# Patient Record
Sex: Male | Born: 1945 | Race: White | Hispanic: No | Marital: Married | Smoking: Never smoker
Health system: Southern US, Community
[De-identification: ages and names within clinical notes are randomized; demographics above are authoritative.]

## PROBLEM LIST (undated history)

## (undated) DIAGNOSIS — Z8709 Personal history of other diseases of the respiratory system: Secondary | ICD-10-CM

## (undated) DIAGNOSIS — E119 Type 2 diabetes mellitus without complications: Secondary | ICD-10-CM

## (undated) DIAGNOSIS — I82409 Acute embolism and thrombosis of unspecified deep veins of unspecified lower extremity: Secondary | ICD-10-CM

## (undated) DIAGNOSIS — M1990 Unspecified osteoarthritis, unspecified site: Secondary | ICD-10-CM

## (undated) DIAGNOSIS — M810 Age-related osteoporosis without current pathological fracture: Secondary | ICD-10-CM

## (undated) DIAGNOSIS — Z87442 Personal history of urinary calculi: Secondary | ICD-10-CM

## (undated) DIAGNOSIS — R7303 Prediabetes: Secondary | ICD-10-CM

## (undated) HISTORY — PX: NO PAST SURGERIES: SHX2092

## (undated) HISTORY — PX: SPINE SURGERY: SHX786

## (undated) HISTORY — DX: Age-related osteoporosis without current pathological fracture: M81.0

## (undated) HISTORY — DX: Type 2 diabetes mellitus without complications: E11.9

---

## 2003-09-04 ENCOUNTER — Ambulatory Visit (HOSPITAL_COMMUNITY): Admission: RE | Admit: 2003-09-04 | Discharge: 2003-09-04 | Payer: Self-pay | Admitting: Gastroenterology

## 2010-01-21 ENCOUNTER — Encounter: Admission: RE | Admit: 2010-01-21 | Discharge: 2010-01-21 | Payer: Self-pay | Admitting: Internal Medicine

## 2010-05-17 ENCOUNTER — Encounter: Admission: RE | Admit: 2010-05-17 | Discharge: 2010-05-17 | Payer: Self-pay | Admitting: Internal Medicine

## 2010-09-17 ENCOUNTER — Ambulatory Visit: Payer: Self-pay | Admitting: Vascular Surgery

## 2010-10-13 DIAGNOSIS — I82409 Acute embolism and thrombosis of unspecified deep veins of unspecified lower extremity: Secondary | ICD-10-CM

## 2010-10-13 HISTORY — DX: Acute embolism and thrombosis of unspecified deep veins of unspecified lower extremity: I82.409

## 2011-02-25 NOTE — Consult Note (Signed)
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  He has been  on chronic Coumadin therapy since that time.  Had a workup with Dr.  Donette Larry of [facility].  I have reviewed this.  He did have a  hypercoagulable workup which was negative.  The patient not recall any  specific trauma or other long travel that could have caused his DVT.  He  does work as a Visual merchandiser.  He reports that he bumps his legs quite  frequently.   PAST MEDICAL HISTORY:  Significant for paresthesias of both feet,  otherwise no medical difficulties.   SOCIAL HISTORY:  He is single with 2 children.  He works as a Visual merchandiser.  He does not smoke or drink alcohol.   FAMILY HISTORY:  Negative for premature atherosclerotic disease.   REVIEW OF SYSTEMS:  CONSTITUTIONAL:  Positive for weight gain up to 250  pounds.  He is 6 feet 1 inch tall.  VASCULAR:  For DVT.  SKIN:  Positive for rashes right below his knee consistent venous  hypertension.   He underwent repeat venous duplex in our office today are I have  reviewed this with the patient.  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  He also has significant deep venous valvular insufficiency.   I discussed at length with the patient I feel that it is safe for him to  stop his Coumadin therapy, and he will discuss this with Dr. Donette Larry.  I  would not recommend any follow-up ultrasounds.  I explained that the  skin changes were related to venous hypertension and that this would be  related mostly to the partial occlusion, also for valvular incompetence  in his right deep veins.  I explained the  importance of elevation and  also the importance of compression.  We have fitted him today with knee-  high 20-mm to 30-mmHg graduated compression stockings and instructed him  on the use of this to his right leg only.  He will continue his follow-  up with Dr. Donette Larry and see [place] again on an as-needed basis.     Larina Earthly, M.D.  Electronically Signed   TFE/MEDQ  D:  09/17/2010  T:  09/18/2010  Job:  [id number]   cc:   Georgann Housekeeper, MD

## 2011-02-25 NOTE — Procedures (Signed)
DUPLEX DEEP VENOUS EXAM - LOWER EXTREMITY   INDICATION:  Follow-up evaluation of known DVT.   HISTORY:  Edema:  Yes.  Trauma/Surgery:  No.  Pain:  No.  PE:  No.  Previous DVT:  Right femoral vein and popliteal vein, spring of 2011.  Anticoagulants:  Coumadin.  Other:   DUPLEX EXAM:                CFV   SFV   PopV  PTV    GSV                R  L  R  L  R  L  R   L  R  L  Thrombosis    o  o  +     +            o  Spontaneous   +  +  +     +  Phasic        +  +  +     +  Augmentation  +  +  +     +  Compressible  +  +  p     p  Competent     o  o  o     o   Legend:  + - yes  o - no  p - partial  D - decreased   IMPRESSION:  1. 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  2. Evidence of clinically significant deep venous insufficiency.  3. Calf veins could not be imaged due to excessive edema.      _____________________________  Larina Earthly, M.D.   EM/MEDQ  D:  09/17/2010  T:  09/17/2010  Job:  [id number]

## 2011-02-28 NOTE — Op Note (Signed)
NAME:  Cameron Young, Cameron Young                           ACCOUNT NO.:  [id number]   MEDICAL RECORD NO.:  [id number]                   PATIENT TYPE:  AMB   LOCATION:  ENDO                                 FACILITY:  [facility]   PHYSICIAN:  John C. Madilyn Fireman, M.D.                 DATE OF BIRTH:  1946-02-26   DATE OF PROCEDURE:  11/01/2002  DATE OF DISCHARGE:  09/04/2003                                 OPERATIVE REPORT   PROCEDURE:  Colonoscopy.   INDICATIONS FOR PROCEDURE:  Colon cancer screening in a 65 year old patient  with no previous screening.   DESCRIPTION OF PROCEDURE:  The patient was placed in the left lateral  decubitus position and placed on the pulse monitor with continuous low-flow  oxygen delivered by nasal cannula.  He was sedated with 62.5 mcg IV fentanyl  and 6 mg IV Versed for the previous EGD, and no further sedation was  required for this procedure.  The Olympus video colonoscope was inserted  into the rectum and advanced to the cecum, confirmed by transillumination at  McBurney's point and visualization of the ileocecal valve and appendiceal  orifice.  The prep was excellent.  The cecum, ascending, and transverse  colon all appeared normal with no masses, polyps, diverticula, or other  mucosal abnormalities.  Within the descending and sigmoid colon, there were  seen a few scattered diverticula and no other abnormalities.  The rectum  appeared normal, and retroflexed view of the anus revealed no obvious  internal hemorrhoids.  The scope was then withdrawn, and the patient  returned to the recovery room in stable condition.  He tolerated the  procedure well, and there were no immediate complications.   IMPRESSION:  1. Left-sided diverticulosis.  2. Otherwise, normal study.   PLAN:  Next colon screening by flexible sigmoidoscopy in 5 years.                                               John C. Madilyn Fireman, M.D.    JCH/MEDQ  D:  11/02/2003  T:  11/02/2003  Job:  [id number]   cc:    Brett Canales A. Cleta Alberts, M.D.  [address]  [place]  [place] [address]  Fax: [phone]

## 2011-02-28 NOTE — Op Note (Signed)
NAME:  Cameron Young, Cameron Young                           ACCOUNT NO.:  [id number]   MEDICAL RECORD NO.:  [id number]                   PATIENT TYPE:  AMB   LOCATION:  ENDO                                 FACILITY:  [facility]   PHYSICIAN:  John C. Madilyn Fireman, M.D.                 DATE OF BIRTH:  1946-04-24   DATE OF PROCEDURE:  09/04/2003  DATE OF DISCHARGE:                                 OPERATIVE REPORT   PROCEDURE:  Esophagogastroduodenoscopy.   INDICATIONS:  Chronic daily heartburn in a 65 year old patient undergoing  screening colonoscopy today.   DESCRIPTION OF PROCEDURE:  The patient was placed in the left lateral  decubitus position and placed on the pulse monitor with continuous low-flow  oxygen delivered by nasal cannula.  He was sedated with 62.5 mcg of fentanyl  and 6 mg IV Versed.  The Olympus video endoscope was advanced under direct  vision into the oropharynx and esophagus.  The esophagus was straight and of  normal caliber, the squamocolumnar line at 40 cm above a 2 cm hiatal hernia.  There was a single erosion with exudate just above the GE junction  consistent with moderate esophagitis.  There was no ring or stricture.  The  stomach was entered and a small amount of liquid secretions were suctioned  from the fundus.  Retroflexed view of the cardia confirmed the hiatal hernia  and was unremarkable.  The fundus, body, antrum and pylorus all appeared  normal.  The duodenum was entered and both bulb and second portion appeared  to be within normal limits.  The scope was then withdrawn and the patient  returned to the recovery room in stable condition.  He tolerated the  procedure well and there were no immediate complications.   IMPRESSION:  1. Small to moderate hiatal hernia.  2. Moderate esophagitis.   PLAN:  Will treat with proton pump inhibitor for at least two months  continue with a PPI or H2 block therapy afterwards.                                               John C.  Madilyn Fireman, M.D.    JCH/MEDQ  D:  09/04/2003  T:  09/04/2003  Job:  [id number]   cc:   Brett Canales A. Cleta Alberts, M.D.  [address]  [place]  [place] [address]  Fax: [phone]

## 2012-02-26 DIAGNOSIS — Z1331 Encounter for screening for depression: Secondary | ICD-10-CM | POA: Diagnosis not present

## 2012-02-26 DIAGNOSIS — R609 Edema, unspecified: Secondary | ICD-10-CM | POA: Diagnosis not present

## 2012-02-26 DIAGNOSIS — E669 Obesity, unspecified: Secondary | ICD-10-CM | POA: Diagnosis not present

## 2012-02-26 DIAGNOSIS — Z0000 Encounter for general adult medical examination without abnormal findings: Secondary | ICD-10-CM | POA: Diagnosis not present

## 2012-02-26 DIAGNOSIS — R7309 Other abnormal glucose: Secondary | ICD-10-CM | POA: Diagnosis not present

## 2012-02-26 DIAGNOSIS — Z125 Encounter for screening for malignant neoplasm of prostate: Secondary | ICD-10-CM | POA: Diagnosis not present

## 2012-02-26 DIAGNOSIS — I872 Venous insufficiency (chronic) (peripheral): Secondary | ICD-10-CM | POA: Diagnosis not present

## 2012-04-02 ENCOUNTER — Encounter (HOSPITAL_COMMUNITY): Payer: Self-pay | Admitting: *Deleted

## 2012-04-02 ENCOUNTER — Emergency Department (HOSPITAL_COMMUNITY)
Admission: EM | Admit: 2012-04-02 | Discharge: 2012-04-03 | Disposition: A | Discharge status: Home / Self Care | Payer: Medicare Other | Attending: Emergency Medicine | Admitting: Emergency Medicine

## 2012-04-02 DIAGNOSIS — K7689 Other specified diseases of liver: Secondary | ICD-10-CM | POA: Diagnosis not present

## 2012-04-02 DIAGNOSIS — N201 Calculus of ureter: Secondary | ICD-10-CM | POA: Insufficient documentation

## 2012-04-02 DIAGNOSIS — R109 Unspecified abdominal pain: Secondary | ICD-10-CM | POA: Diagnosis not present

## 2012-04-02 DIAGNOSIS — R110 Nausea: Secondary | ICD-10-CM | POA: Diagnosis not present

## 2012-04-02 DIAGNOSIS — N1330 Unspecified hydronephrosis: Secondary | ICD-10-CM | POA: Insufficient documentation

## 2012-04-02 DIAGNOSIS — N23 Unspecified renal colic: Secondary | ICD-10-CM

## 2012-04-02 DIAGNOSIS — Z86718 Personal history of other venous thrombosis and embolism: Secondary | ICD-10-CM | POA: Insufficient documentation

## 2012-04-02 HISTORY — DX: Acute embolism and thrombosis of unspecified deep veins of unspecified lower extremity: I82.409

## 2012-04-02 NOTE — ED Notes (Signed)
Pt c/o L groin pain starting at 1830 tonight. Pt c/o nausea, denies vomiting and dysuria.

## 2012-04-03 ENCOUNTER — Emergency Department (HOSPITAL_COMMUNITY): Payer: Medicare Other

## 2012-04-03 DIAGNOSIS — N1330 Unspecified hydronephrosis: Secondary | ICD-10-CM | POA: Diagnosis not present

## 2012-04-03 DIAGNOSIS — K7689 Other specified diseases of liver: Secondary | ICD-10-CM | POA: Diagnosis not present

## 2012-04-03 DIAGNOSIS — N201 Calculus of ureter: Secondary | ICD-10-CM | POA: Diagnosis not present

## 2012-04-03 LAB — URINALYSIS, ROUTINE W REFLEX MICROSCOPIC
Nitrite: NEGATIVE
Protein, ur: 30 mg/dL — AB
Specific Gravity, Urine: 1.027 (ref 1.005–1.030)
Urobilinogen, UA: 1 mg/dL (ref 0.0–1.0)
pH: 7 (ref 5.0–8.0)

## 2012-04-03 LAB — CBC
RBC: 5.17 MIL/uL (ref 4.22–5.81)
RDW: 12.8 % (ref 11.5–15.5)

## 2012-04-03 LAB — BASIC METABOLIC PANEL
BUN: 19 mg/dL (ref 6–23)
CO2: 22 mEq/L (ref 19–32)
Calcium: 9.9 mg/dL (ref 8.4–10.5)
Creatinine, Ser: 1.18 mg/dL (ref 0.50–1.35)
GFR calc non Af Amer: 63 mL/min — ABNORMAL LOW (ref >90)
Glucose, Bld: 164 mg/dL — ABNORMAL HIGH (ref 70–99)

## 2012-04-03 LAB — URINE MICROSCOPIC-ADD ON

## 2012-04-03 LAB — DIFFERENTIAL
Basophils Relative: 0 % (ref 0–1)
Eosinophils Absolute: 0 10*3/uL (ref 0.0–0.7)
Lymphocytes Relative: 11 % — ABNORMAL LOW (ref 12–46)
Monocytes Absolute: 0.7 10*3/uL (ref 0.1–1.0)

## 2012-04-03 MED ORDER — NAPROXEN SODIUM 220 MG PO TABS: ORAL_TABLET | ORAL | Status: DC

## 2012-04-03 MED ORDER — KETOROLAC TROMETHAMINE 15 MG/ML IJ SOLN
15.0000 mg | Freq: Once | INTRAMUSCULAR | Status: AC
  Administered 2012-04-03: 15 mg via INTRAVENOUS
  Filled 2012-04-03: qty 1

## 2012-04-03 MED ORDER — HYDROMORPHONE HCL 2 MG PO TABS: 2.0000 mg | ORAL_TABLET | ORAL | Status: AC | PRN

## 2012-04-03 MED ORDER — HYDROMORPHONE HCL PF 1 MG/ML IJ SOLN
1.0000 mg | Freq: Once | INTRAMUSCULAR | Status: AC
  Administered 2012-04-03: 1 mg via INTRAVENOUS
  Filled 2012-04-03: qty 1

## 2012-04-03 MED ORDER — ONDANSETRON HCL 4 MG/2ML IJ SOLN
4.0000 mg | Freq: Once | INTRAMUSCULAR | Status: AC
  Administered 2012-04-03: 4 mg via INTRAVENOUS
  Filled 2012-04-03: qty 2

## 2012-04-03 MED ORDER — ONDANSETRON 8 MG PO TBDP: 8.0000 mg | ORAL_TABLET | Freq: Three times a day (TID) | ORAL | Status: AC | PRN

## 2012-04-03 NOTE — Discharge Instructions (Signed)
Ureteral Colic (Kidney Stones) Ureteral colic is the result of a condition when kidney stones form inside the kidney. Once kidney stones are formed they may move into the tube that connects the kidney with the bladder (ureter). If this occurs, this condition may cause pain (colic) in the ureter.  CAUSES  Pain is caused by stone movement in the ureter and the obstruction caused by the stone. SYMPTOMS  The pain comes and goes as the ureter contracts around the stone. The pain is usually intense, sharp, and stabbing in character. The location of the pain may move as the stone moves through the ureter. When the stone is near the kidney the pain is usually located in the back and radiates to the belly (abdomen). When the stone is ready to pass into the bladder the pain is often located in the lower abdomen on the side the stone is located. At this location, the symptoms may mimic those of a urinary tract infection with urinary frequency. Once the stone is located here it often passes into the bladder and the pain disappears completely. TREATMENT   Your caregiver will provide you with medicine for pain relief.   You may require specialized follow-up X-rays.   The absence of pain does not always mean that the stone has passed. It may have just stopped moving. If the urine remains completely obstructed, it can cause loss of kidney function or even complete destruction of the involved kidney. It is your responsibility and in your interest that X-rays and follow-ups as suggested by your caregiver are completed. Relief of pain without passage of the stone can be associated with severe damage to the kidney, including loss of kidney function on that side.   If your stone does not pass on its own, additional measures may be taken by your caregiver to ensure its removal.  HOME CARE INSTRUCTIONS   Increase your fluid intake. Water is the preferred fluid since juices containing vitamin C may acidify the urine making  it less likely for certain stones (uric acid stones) to pass.   Strain all urine. A strainer will be provided. Keep all particulate matter or stones for your caregiver to inspect.   Take your pain medicine as directed.   Make a follow-up appointment with your caregiver as directed.   Remember that the goal is passage of your stone. The absence of pain does not mean the stone is gone. Follow your caregiver's instructions.   Only take over-the-counter or prescription medicines for pain, discomfort, or fever as directed by your caregiver.  SEEK MEDICAL CARE IF:   Pain cannot be controlled with the prescribed medicine.   You have a fever.   Pain continues for longer than your caregiver advises it should.   There is a change in the pain, and you develop chest discomfort or constant abdominal pain.   You feel faint or pass out.  MAKE SURE YOU:   Understand these instructions.   Will watch your condition.   Will get help right away if you are not doing well or get worse.  Document Released: 07/09/2005 Document Revised: 09/18/2011 Document Reviewed: 03/26/2011 Exit[employer] Patient Information 2012 [employer]. 

## 2012-04-03 NOTE — ED Provider Notes (Signed)
History     CSN: [id number]  Arrival date & time 04/02/12  2247   First MD Initiated Contact with Patient 04/03/12 0044      Chief Complaint  Patient presents with  . Groin Pain    (Consider location/radiation/quality/duration/timing/severity/associated sxs/prior treatment) HPI This is a 66 year old white male with a remote history of kidney stones. He is here with pain in his left groin beginning about 6:30 PM. The pain has been colicky and severe at times. He has had nausea and retching. He has not noted any hematuria or dysuria. There are no mitigating or exacerbating factors. The pain feels as if it is in the left testicle radiating up into his lower abdomen.  Past Medical History  Diagnosis Date  . Kidney stone   . DVT (deep venous thrombosis)     History reviewed. No pertinent past surgical history.  History reviewed. No pertinent family history.  History  Substance Use Topics  . Smoking status: Former Games developer  . Smokeless tobacco: Not on file  . Alcohol Use: No      Review of Systems  All other systems reviewed and are negative.    Allergies  Review of patient's allergies indicates no known allergies.  Home Medications  No current outpatient prescriptions on file.  BP 175/87  Pulse 78  Temp 97.7 F (36.5 C)  Resp 22  SpO2 98%  Physical Exam General: Well-develoxped, well-nourished male in no acute distress; appearance consistent with age of record HENT: normocephalic, atraumatic Eyes: pupils equal round and reactive to light; extraocular muscles intact Neck: supple Heart: regular rate and rhythm Lungs: clear to auscultation bilaterally Abdomen: soft; nondistended; nontender; bowel sounds present GU: no flank tenderness; no testicular mass or tenderness palpated Extremities: No deformity; full range of motion Neurologic: Awake, alert and oriented; motor function intact in all extremities and symmetric; no facial droop Skin: Warm and  dry Psychiatric: Normal mood and affect    ED Course  Procedures (including critical care time)     MDM   Nursing notes and vitals signs, including pulse oximetry, reviewed.  Summary of this visit's results, reviewed by myself:  Labs:  Results for orders placed during the hospital encounter of 04/02/12  URINALYSIS, ROUTINE W REFLEX MICROSCOPIC      Component Value Range   Color, Urine RED (*) YELLOW   APPearance CLOUDY (*) CLEAR   Specific Gravity, Urine 1.027  1.005 - 1.030   pH 7.0  5.0 - 8.0   Glucose, UA NEGATIVE  NEGATIVE mg/dL   Hgb urine dipstick LARGE (*) NEGATIVE   Bilirubin Urine NEGATIVE  NEGATIVE   Ketones, ur TRACE (*) NEGATIVE mg/dL   Protein, ur 30 (*) NEGATIVE mg/dL   Urobilinogen, UA 1.0  0.0 - 1.0 mg/dL   Nitrite NEGATIVE  NEGATIVE   Leukocytes, UA SMALL (*) NEGATIVE  CBC      Component Value Range   WBC 12.6 (*) 4.0 - 10.5 K/uL   RBC 5.17  4.22 - 5.81 MIL/uL   Hemoglobin 16.1  13.0 - 17.0 g/dL   HCT 13.0  86.5 - 78.4 %   MCV 89.7  78.0 - 100.0 fL   MCH 31.1  26.0 - 34.0 pg   MCHC 34.7  30.0 - 36.0 g/dL   RDW 69.6  29.5 - 28.4 %   Platelets 192  150 - 400 K/uL  DIFFERENTIAL      Component Value Range   Neutrophils Relative 83 (*) 43 - 77 %  Neutro Abs 10.5 (*) 1.7 - 7.7 K/uL   Lymphocytes Relative 11 (*) 12 - 46 %   Lymphs Abs 1.4  0.7 - 4.0 K/uL   Monocytes Relative 6  3 - 12 %   Monocytes Absolute 0.7  0.1 - 1.0 K/uL   Eosinophils Relative 0  0 - 5 %   Eosinophils Absolute 0.0  0.0 - 0.7 K/uL   Basophils Relative 0  0 - 1 %   Basophils Absolute 0.0  0.0 - 0.1 K/uL  BASIC METABOLIC PANEL      Component Value Range   Sodium 136  135 - 145 mEq/L   Potassium 5.8 (*) 3.5 - 5.1 mEq/L   Chloride 101  96 - 112 mEq/L   CO2 22  19 - 32 mEq/L   Glucose, Bld 164 (*) 70 - 99 mg/dL   BUN 19  6 - 23 mg/dL   Creatinine, Ser 8.29  0.50 - 1.35 mg/dL   Calcium 9.9  8.4 - 56.2 mg/dL   GFR calc non Af Amer 63 (*) >90 mL/min   GFR calc Af Amer 72 (*)  >90 mL/min  URINE MICROSCOPIC-ADD ON      Component Value Range   Squamous Epithelial / LPF RARE  RARE   WBC, UA 3-6  <3 WBC/hpf   RBC / HPF TOO NUMEROUS TO COUNT  <3 RBC/hpf   Bacteria, UA MANY (*) RARE   Urine-Other MUCOUS PRESENT      Imaging Studies: Ct Abdomen Pelvis Wo Contrast  04/03/2012  *RADIOLOGY REPORT*  Clinical Data: Groin pain.  CT ABDOMEN AND PELVIS WITHOUT CONTRAST  Technique:  Multidetector CT imaging of the abdomen and pelvis was performed following the standard protocol without intravenous contrast.  Comparison: None.  Findings: Dependent atelectasis in the lower lobes.  Heart is normal size.  No effusions.  Diffuse fatty infiltration of the liver.  Gallbladder, stomach, spleen, pancreas, adrenals and right kidney are unremarkable. There is left hydronephrosis and perinephric stranding due to a 5 mm stone at the pelvic brim.  Urinary bladder is unremarkable. Prostate mildly prominent.  Extensive descending colonic and sigmoid diverticulosis.  No active diverticulitis.  Small bowel is decompressed.  No free fluid, free air or adenopathy.  No acute bony abnormality.  IMPRESSION: 5 mm mid left ureteral stone at the pelvic brim with mild left hydronephrosis and perinephric stranding.  Fatty infiltration of the liver.  Original Report Authenticated By: Cyndie Chime, M.D.   1:39 AM Pain well-controlled.       Hanley Seamen, MD 04/03/12 [phone]

## 2012-04-03 NOTE — ED Notes (Signed)
Pt verbalizes understanding.  Pt wife at bedside 

## 2012-09-28 DIAGNOSIS — R7309 Other abnormal glucose: Secondary | ICD-10-CM | POA: Diagnosis not present

## 2013-02-28 DIAGNOSIS — E119 Type 2 diabetes mellitus without complications: Secondary | ICD-10-CM | POA: Diagnosis not present

## 2013-02-28 DIAGNOSIS — E669 Obesity, unspecified: Secondary | ICD-10-CM | POA: Diagnosis not present

## 2013-02-28 DIAGNOSIS — Z0000 Encounter for general adult medical examination without abnormal findings: Secondary | ICD-10-CM | POA: Diagnosis not present

## 2013-02-28 DIAGNOSIS — I872 Venous insufficiency (chronic) (peripheral): Secondary | ICD-10-CM | POA: Diagnosis not present

## 2013-02-28 DIAGNOSIS — N400 Enlarged prostate without lower urinary tract symptoms: Secondary | ICD-10-CM | POA: Diagnosis not present

## 2013-02-28 DIAGNOSIS — Z1331 Encounter for screening for depression: Secondary | ICD-10-CM | POA: Diagnosis not present

## 2013-03-25 DIAGNOSIS — H524 Presbyopia: Secondary | ICD-10-CM | POA: Diagnosis not present

## 2013-03-25 DIAGNOSIS — E119 Type 2 diabetes mellitus without complications: Secondary | ICD-10-CM | POA: Diagnosis not present

## 2013-03-25 DIAGNOSIS — H52209 Unspecified astigmatism, unspecified eye: Secondary | ICD-10-CM | POA: Diagnosis not present

## 2013-08-29 DIAGNOSIS — Z23 Encounter for immunization: Secondary | ICD-10-CM | POA: Diagnosis not present

## 2013-08-29 DIAGNOSIS — E119 Type 2 diabetes mellitus without complications: Secondary | ICD-10-CM | POA: Diagnosis not present

## 2013-08-29 DIAGNOSIS — I872 Venous insufficiency (chronic) (peripheral): Secondary | ICD-10-CM | POA: Diagnosis not present

## 2014-03-15 ENCOUNTER — Ambulatory Visit
Admission: RE | Admit: 2014-03-15 | Discharge: 2014-03-15 | Disposition: A | Discharge status: Home / Self Care | Payer: Medicare Other | Source: Ambulatory Visit | Attending: Internal Medicine | Admitting: Internal Medicine

## 2014-03-15 ENCOUNTER — Other Ambulatory Visit: Payer: Self-pay | Admitting: Internal Medicine

## 2014-03-15 DIAGNOSIS — M169 Osteoarthritis of hip, unspecified: Secondary | ICD-10-CM | POA: Diagnosis not present

## 2014-03-15 DIAGNOSIS — M25559 Pain in unspecified hip: Secondary | ICD-10-CM

## 2014-03-15 DIAGNOSIS — M1610 Unilateral primary osteoarthritis, unspecified hip: Secondary | ICD-10-CM | POA: Diagnosis not present

## 2014-03-30 DIAGNOSIS — M25559 Pain in unspecified hip: Secondary | ICD-10-CM | POA: Diagnosis not present

## 2014-04-20 DIAGNOSIS — M25519 Pain in unspecified shoulder: Secondary | ICD-10-CM | POA: Diagnosis not present

## 2014-04-26 DIAGNOSIS — M1610 Unilateral primary osteoarthritis, unspecified hip: Secondary | ICD-10-CM | POA: Diagnosis not present

## 2014-05-08 DIAGNOSIS — M545 Low back pain, unspecified: Secondary | ICD-10-CM | POA: Diagnosis not present

## 2014-05-08 DIAGNOSIS — M1610 Unilateral primary osteoarthritis, unspecified hip: Secondary | ICD-10-CM | POA: Diagnosis not present

## 2014-05-08 DIAGNOSIS — M25559 Pain in unspecified hip: Secondary | ICD-10-CM | POA: Diagnosis not present

## 2014-05-11 ENCOUNTER — Other Ambulatory Visit: Payer: Self-pay | Admitting: Orthopedic Surgery

## 2014-05-11 DIAGNOSIS — M1611 Unilateral primary osteoarthritis, right hip: Secondary | ICD-10-CM

## 2014-05-11 DIAGNOSIS — M4807 Spinal stenosis, lumbosacral region: Secondary | ICD-10-CM

## 2014-05-19 ENCOUNTER — Ambulatory Visit
Admission: RE | Admit: 2014-05-19 | Discharge: 2014-05-19 | Disposition: A | Discharge status: Home / Self Care | Payer: Medicare Other | Source: Ambulatory Visit | Attending: Orthopedic Surgery | Admitting: Orthopedic Surgery

## 2014-05-19 DIAGNOSIS — M5126 Other intervertebral disc displacement, lumbar region: Secondary | ICD-10-CM | POA: Diagnosis not present

## 2014-05-19 DIAGNOSIS — M545 Low back pain, unspecified: Secondary | ICD-10-CM | POA: Diagnosis not present

## 2014-05-19 DIAGNOSIS — M4807 Spinal stenosis, lumbosacral region: Secondary | ICD-10-CM

## 2014-05-19 DIAGNOSIS — M1611 Unilateral primary osteoarthritis, right hip: Secondary | ICD-10-CM

## 2014-05-19 MED ORDER — IOHEXOL 180 MG/ML  SOLN
15.0000 mL | Freq: Once | INTRAMUSCULAR | Status: AC | PRN
  Administered 2014-05-19: 15 mL via INTRATHECAL

## 2014-05-19 MED ORDER — DIAZEPAM 5 MG PO TABS
10.0000 mg | ORAL_TABLET | Freq: Once | ORAL | Status: AC
  Administered 2014-05-19: 10 mg via ORAL

## 2014-05-19 MED ORDER — ONDANSETRON HCL 4 MG/2ML IJ SOLN: 4.0000 mg | Freq: Four times a day (QID) | INTRAMUSCULAR | Status: DC | PRN

## 2014-05-19 NOTE — Discharge Instructions (Signed)

## 2014-05-26 ENCOUNTER — Other Ambulatory Visit: Payer: Self-pay | Admitting: Surgical

## 2014-05-26 DIAGNOSIS — M5126 Other intervertebral disc displacement, lumbar region: Secondary | ICD-10-CM | POA: Diagnosis not present

## 2014-05-30 ENCOUNTER — Encounter (HOSPITAL_COMMUNITY): Payer: Self-pay | Admitting: Pharmacy Technician

## 2014-05-30 ENCOUNTER — Encounter (HOSPITAL_COMMUNITY)
Admission: RE | Admit: 2014-05-30 | Discharge: 2014-05-30 | Disposition: A | Discharge status: Home / Self Care | Payer: Medicare Other | Source: Ambulatory Visit | Attending: Orthopedic Surgery | Admitting: Orthopedic Surgery

## 2014-05-30 ENCOUNTER — Ambulatory Visit (HOSPITAL_COMMUNITY)
Admission: RE | Admit: 2014-05-30 | Discharge: 2014-05-30 | Disposition: A | Discharge status: Home / Self Care | Payer: Medicare Other | Source: Ambulatory Visit | Attending: Surgical | Admitting: Surgical

## 2014-05-30 ENCOUNTER — Encounter (HOSPITAL_COMMUNITY): Payer: Self-pay

## 2014-05-30 DIAGNOSIS — M5137 Other intervertebral disc degeneration, lumbosacral region: Secondary | ICD-10-CM | POA: Diagnosis not present

## 2014-05-30 DIAGNOSIS — M51379 Other intervertebral disc degeneration, lumbosacral region without mention of lumbar back pain or lower extremity pain: Secondary | ICD-10-CM | POA: Diagnosis not present

## 2014-05-30 DIAGNOSIS — Z01818 Encounter for other preprocedural examination: Secondary | ICD-10-CM | POA: Insufficient documentation

## 2014-05-30 DIAGNOSIS — J40 Bronchitis, not specified as acute or chronic: Secondary | ICD-10-CM | POA: Diagnosis not present

## 2014-05-30 DIAGNOSIS — M519 Unspecified thoracic, thoracolumbar and lumbosacral intervertebral disc disorder: Secondary | ICD-10-CM | POA: Diagnosis not present

## 2014-05-30 HISTORY — DX: Prediabetes: R73.03

## 2014-05-30 HISTORY — DX: Personal history of other diseases of the respiratory system: Z87.09

## 2014-05-30 HISTORY — DX: Unspecified osteoarthritis, unspecified site: M19.90

## 2014-05-30 HISTORY — DX: Personal history of urinary calculi: Z87.442

## 2014-05-30 LAB — CBC
HCT: 43.2 % (ref 39.0–52.0)
Hemoglobin: 14.6 g/dL (ref 13.0–17.0)
MCH: 30.6 pg (ref 26.0–34.0)
MCHC: 33.8 g/dL (ref 30.0–36.0)
MCV: 90.6 fL (ref 78.0–100.0)
PLATELETS: 207 10*3/uL (ref 150–400)
RBC: 4.77 MIL/uL (ref 4.22–5.81)
RDW: 12.3 % (ref 11.5–15.5)
WBC: 8.1 10*3/uL (ref 4.0–10.5)

## 2014-05-30 LAB — URINALYSIS, ROUTINE W REFLEX MICROSCOPIC
Bilirubin Urine: NEGATIVE
Glucose, UA: NEGATIVE mg/dL
Hgb urine dipstick: NEGATIVE
Ketones, ur: NEGATIVE mg/dL
Leukocytes, UA: NEGATIVE
Nitrite: NEGATIVE
Protein, ur: NEGATIVE mg/dL
Specific Gravity, Urine: 1.026 (ref 1.005–1.030)
Urobilinogen, UA: 1 mg/dL (ref 0.0–1.0)
pH: 5 (ref 5.0–8.0)

## 2014-05-30 LAB — COMPREHENSIVE METABOLIC PANEL
ALT: 23 U/L (ref 0–53)
AST: 21 U/L (ref 0–37)
Albumin: 3.7 g/dL (ref 3.5–5.2)
Alkaline Phosphatase: 61 U/L (ref 39–117)
Anion gap: 14 (ref 5–15)
BUN: 13 mg/dL (ref 6–23)
CO2: 24 mEq/L (ref 19–32)
Calcium: 9.7 mg/dL (ref 8.4–10.5)
Chloride: 101 mEq/L (ref 96–112)
Creatinine, Ser: 1.05 mg/dL (ref 0.50–1.35)
GFR calc Af Amer: 82 mL/min — ABNORMAL LOW (ref >90)
GFR calc non Af Amer: 71 mL/min — ABNORMAL LOW (ref >90)
Glucose, Bld: 130 mg/dL — ABNORMAL HIGH (ref 70–99)
Potassium: 4.1 mEq/L (ref 3.7–5.3)
Sodium: 139 mEq/L (ref 137–147)
Total Bilirubin: 0.5 mg/dL (ref 0.3–1.2)
Total Protein: 7.3 g/dL (ref 6.0–8.3)

## 2014-05-30 LAB — SURGICAL PCR SCREEN
MRSA, PCR: POSITIVE — AB
Staphylococcus aureus: POSITIVE — AB

## 2014-05-30 LAB — PROTIME-INR
INR: 1.05 (ref 0.00–1.49)
Prothrombin Time: 13.7 seconds (ref 11.6–15.2)

## 2014-05-30 LAB — APTT: aPTT: 31 seconds (ref 24–37)

## 2014-05-30 NOTE — Progress Notes (Signed)
Mupirocin 2% ointment called into [facility] on [address]. Positive results faxed to Dr. Charlestine Night pod, left a message with Judeen Hammans at [place], and left a message with the patient about the Mupirocin treatment.

## 2014-05-30 NOTE — H&P (Signed)
Cameron Young is an 68 y.o. male.   Chief Complaint: back pain HPI: The patient is a 68 year old male with the chief complaint of low back pain. He has been dealing with this for about 3 months. No injury or change in activity. He started having pain along the lateral aspect of his right hip and was unresponsive to treatment over his greater trochanteric bursa. 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 He continued to develop increased low back pain as well as radicular symptoms in the right LE. CT of the lumbar spine showed lumbar disc herniation at L4-L5 on the right.   Past Medical History  Diagnosis Date  . Kidney stone   . DVT (deep venous thrombosis)   Diabetes mellitus, type II  Social History:  reports that he has quit smoking. He reports that he does not drink alcohol or use illicit drugs.  Allergies: No Known Allergies   Current outpatient prescriptions:  MetFORMIN HCl (500MG  Tablet, Oral)    Review of Systems  Constitutional: Negative.   HENT: Negative.   Eyes: Negative.   Respiratory: Negative.   Cardiovascular: Negative.   Gastrointestinal: Negative.   Genitourinary: Negative.   Musculoskeletal: Positive for back pain and joint pain. Negative for falls, myalgias and neck pain.  Skin: Negative.   Neurological: Positive for tingling, sensory change and focal weakness. Negative for dizziness, tremors, speech change, seizures and loss of consciousness.  Endo/Heme/Allergies: Negative.   Psychiatric/Behavioral: Negative.    Vitals Weight: 252 lb Height: 72 in Body Surface Area: 2.41 m Body Mass Index: 34.18 kg/m Pulse: 74 (Regular) BP: 143/87 (Sitting, Left Arm, Standard)  Physical Exam  Constitutional: He is oriented to person, place, and time. He appears well-developed and well-nourished.  Obese  HENT:  Head: Normocephalic and atraumatic.  Right Ear: External ear normal.  Left Ear: External ear normal.  Nose: Nose normal.   Mouth/Throat: Oropharynx is clear and moist.  Eyes: Conjunctivae and EOM are normal.  Neck: Normal range of motion. Neck supple.  Cardiovascular: Normal rate, regular rhythm, normal heart sounds and intact distal pulses.   No murmur heard. Respiratory: Effort normal and breath sounds normal. No respiratory distress. He has no wheezes.  GI: Soft. Bowel sounds are normal. He exhibits no distension. There is no tenderness.  Musculoskeletal:       Right hip: He exhibits decreased range of motion. He exhibits normal strength.       Left hip: Normal.       Right knee: Normal.       Left knee: Normal.       Lumbar back: He exhibits decreased range of motion, tenderness, pain and spasm.       Right lower leg: He exhibits no tenderness and no swelling.       Left lower leg: He exhibits no tenderness and no swelling.  Neurological: He is alert and oriented to person, place, and time. He has normal reflexes. A sensory deficit is present.  Weakness of the dorsiflexors and toe extensors of the right foot. Decreased sensation to light touch along L4-L5 distribution  Skin: No rash noted. No erythema.  Psychiatric: He has a normal mood and affect. His behavior is normal.     Assessment/Plan Lumbar disc herniation L4-L5 right He needs a lumbar hemilaminectomy and microdiscectomy L4-L5 on the right. The possible complications of spinal surgery number one could be infection, which is extremely rare. We do use antibiotics prior to the surgery  and during surgery and after surgery. Number two is always a slight degree of probability that you could develop a blood clot in your leg after any type of surgery and we try our best to prevent that with aspirin post op when it is safe to begin. The third is a dural leak. That is the spinal fluid leak that could occur. At certain rare times the bone or the disc could literally stick to the dura which is the lining which contains the spinal fluid and we could develop a  small tear in that lining which we then patch up. That is an extremely rare complication. The last and final complication is a recurrent disc rupture. That means that you could rupture another small piece of disc later on down the road and there is about a 2% chance of that.  Cameron Young LAUREN 05/30/2014, 11:46 AM  Physical performed by Dr. Latanya Maudlin, MD Documented by Ardeen Jourdain, PA-C

## 2014-05-30 NOTE — Patient Instructions (Addendum)
Blaine  05/30/2014   Your procedure is scheduled on: Friday 06/02/14  Report to [facility] at 07:00 AM.  Call this number if you have problems the morning of surgery [phone]: [phone]   Remember:   Do not eat food or drink liquids After Midnight.   Do not wear jewelry, make-up or nail polish.  Do not wear lotions, powders, or perfumes. You may wear deodorant.  Do not shave 48 hours prior to surgery. Men may shave face and neck.  Do not bring valuables to the hospital.  Contacts, dentures or bridgework may not be worn into surgery.  Leave suitcase in the car. After surgery it may be brought to your room.   Please read over the following fact sheets that you were given: MRSA Information Paulette Blanch, RN  pre op nurse call if needed [phone]    [facility] - Preparing for Surgery Before surgery, you can play an important role.  Because skin is not sterile, your skin needs to be as free of germs as possible.  You can reduce the number of germs on your skin by washing with CHG (chlorahexidine gluconate) soap before surgery.  CHG is an antiseptic cleaner which kills germs and bonds with the skin to continue killing germs even after washing. Please DO NOT use if you have an allergy to CHG or antibacterial soaps.  If your skin becomes reddened/irritated stop using the CHG and inform your nurse when you arrive at Short Stay. Do not shave (including legs and underarms) for at least 48 hours prior to the first CHG shower.  You may shave your face/neck. Please follow these instructions carefully:  1.  Shower with CHG Soap the night before surgery and the  morning of Surgery.  2.  If you choose to wash your hair, wash your hair first as usual with your  normal  shampoo.  3.  After you shampoo, rinse your hair and body thoroughly to remove the  shampoo.                            4.  Use CHG as you would any other liquid soap.  You can apply chg directly  to the skin and  wash                       Gently with a scrungie or clean washcloth.  5.  Apply the CHG Soap to your body ONLY FROM THE NECK DOWN.   Do not use on face/ open                           Wound or open sores. Avoid contact with eyes, ears mouth and genitals (private parts).                       Wash face,  Genitals (private parts) with your normal soap.             6.  Wash thoroughly, paying special attention to the area where your surgery  will be performed.  7.  Thoroughly rinse your body with warm water from the neck down.  8.  DO NOT shower/wash with your normal soap after using and rinsing off  the CHG Soap.                9.  Fraser Din  yourself dry with a clean towel.            10.  Wear clean pajamas.            11.  Place clean sheets on your bed the night of your first shower and do not  sleep with pets. Day of Surgery : Do not apply any lotions/deodorants the morning of surgery.  Please wear clean clothes to the hospital/surgery center.  FAILURE TO FOLLOW THESE INSTRUCTIONS MAY RESULT IN THE CANCELLATION OF YOUR SURGERY PATIENT SIGNATURE_________________________________  NURSE SIGNATURE__________________________________  ________________________________________________________________________   Adam Phenix  An incentive spirometer is a tool that can help keep your lungs clear and active. This tool measures how well you are filling your lungs with each breath. Taking long deep breaths may help reverse or decrease the chance of developing breathing (pulmonary) problems (especially infection) following:  A long period of time when you are unable to move or be active. BEFORE THE PROCEDURE   If the spirometer includes an indicator to show your best effort, your nurse or respiratory therapist will set it to a desired goal.  If possible, sit up straight or lean slightly forward. Try not to slouch.  Hold the incentive spirometer in an upright position. INSTRUCTIONS FOR USE   1. Sit on the edge of your bed if possible, or sit up as far as you can in bed or on a chair. 2. Hold the incentive spirometer in an upright position. 3. Breathe out normally. 4. Place the mouthpiece in your mouth and seal your lips tightly around it. 5. Breathe in slowly and as deeply as possible, raising the piston or the ball toward the top of the column. 6. Hold your breath for 3-5 seconds or for as long as possible. Allow the piston or ball to fall to the bottom of the column. 7. Remove the mouthpiece from your mouth and breathe out normally. 8. Rest for a few seconds and repeat Steps 1 through 7 at least 10 times every 1-2 hours when you are awake. Take your time and take a few normal breaths between deep breaths. 9. The spirometer may include an indicator to show your best effort. Use the indicator as a goal to work toward during each repetition. 10. After each set of 10 deep breaths, practice coughing to be sure your lungs are clear. If you have an incision (the cut made at the time of surgery), support your incision when coughing by placing a pillow or rolled up towels firmly against it. Once you are able to get out of bed, walk around indoors and cough well. You may stop using the incentive spirometer when instructed by your caregiver.  RISKS AND COMPLICATIONS  Take your time so you do not get dizzy or light-headed.  If you are in pain, you may need to take or ask for pain medication before doing incentive spirometry. It is harder to take a deep breath if you are having pain. AFTER USE  Rest and breathe slowly and easily.  It can be helpful to keep track of a log of your progress. Your caregiver can provide you with a simple table to help with this. If you are using the spirometer at home, follow these instructions: [place] IF:   You are having difficultly using the spirometer.  You have trouble using the spirometer as often as instructed.  Your pain medication is  not giving enough relief while using the spirometer.  You develop fever of  100.5 F (38.1 C) or higher. SEEK IMMEDIATE MEDICAL CARE IF:   You cough up bloody sputum that had not been present before.  You develop fever of 102 F (38.9 C) or greater.  You develop worsening pain at or near the incision site. MAKE SURE YOU:   Understand these instructions.  Will watch your condition.  Will get help right away if you are not doing well or get worse. Document Released: 02/09/2007 Document Revised: 12/22/2011 Document Reviewed: 04/12/2007 [facility] Patient Information 2014 [place], [place].   ________________________________________________________________________

## 2014-05-31 NOTE — Progress Notes (Signed)
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

## 2014-06-02 ENCOUNTER — Ambulatory Visit (HOSPITAL_COMMUNITY): Payer: Medicare Other

## 2014-06-02 ENCOUNTER — Observation Stay (HOSPITAL_COMMUNITY)
Admission: RE | Admit: 2014-06-02 | Discharge: 2014-06-03 | Disposition: A | Discharge status: Home / Self Care | Payer: Medicare Other | Source: Ambulatory Visit | Attending: Orthopedic Surgery | Admitting: Orthopedic Surgery

## 2014-06-02 ENCOUNTER — Encounter (HOSPITAL_COMMUNITY): Payer: Medicare Other | Admitting: Anesthesiology

## 2014-06-02 ENCOUNTER — Ambulatory Visit (HOSPITAL_COMMUNITY): Payer: Medicare Other | Admitting: Anesthesiology

## 2014-06-02 ENCOUNTER — Encounter (HOSPITAL_COMMUNITY): Payer: Self-pay | Admitting: *Deleted

## 2014-06-02 ENCOUNTER — Encounter (HOSPITAL_COMMUNITY)
Admission: RE | Disposition: A | Discharge status: Home / Self Care | Payer: Self-pay | Source: Ambulatory Visit | Attending: Orthopedic Surgery

## 2014-06-02 DIAGNOSIS — M216X9 Other acquired deformities of unspecified foot: Secondary | ICD-10-CM | POA: Diagnosis not present

## 2014-06-02 DIAGNOSIS — M48062 Spinal stenosis, lumbar region with neurogenic claudication: Secondary | ICD-10-CM | POA: Diagnosis present

## 2014-06-02 DIAGNOSIS — Z79899 Other long term (current) drug therapy: Secondary | ICD-10-CM | POA: Insufficient documentation

## 2014-06-02 DIAGNOSIS — M5126 Other intervertebral disc displacement, lumbar region: Secondary | ICD-10-CM | POA: Diagnosis not present

## 2014-06-02 DIAGNOSIS — E119 Type 2 diabetes mellitus without complications: Secondary | ICD-10-CM | POA: Diagnosis not present

## 2014-06-02 DIAGNOSIS — M519 Unspecified thoracic, thoracolumbar and lumbosacral intervertebral disc disorder: Secondary | ICD-10-CM | POA: Diagnosis not present

## 2014-06-02 DIAGNOSIS — M48061 Spinal stenosis, lumbar region without neurogenic claudication: Secondary | ICD-10-CM | POA: Diagnosis not present

## 2014-06-02 DIAGNOSIS — Z86718 Personal history of other venous thrombosis and embolism: Secondary | ICD-10-CM | POA: Insufficient documentation

## 2014-06-02 HISTORY — PX: HEMI-MICRODISCECTOMY LUMBAR LAMINECTOMY LEVEL 1: SHX5846

## 2014-06-02 SURGERY — HEMI-MICRODISCECTOMY LUMBAR LAMINECTOMY LEVEL 1
Anesthesia: General | Site: Back | Laterality: Right

## 2014-06-02 MED ORDER — LACTATED RINGERS IV SOLN
INTRAVENOUS | Status: DC
  Administered 2014-06-02: 100 mL/h via INTRAVENOUS
  Administered 2014-06-02: 21:00:00 via INTRAVENOUS

## 2014-06-02 MED ORDER — BUPIVACAINE-EPINEPHRINE (PF) 0.5% -1:200000 IJ SOLN
INTRAMUSCULAR | Status: AC
  Filled 2014-06-02: qty 30

## 2014-06-02 MED ORDER — LACTATED RINGERS IV SOLN: INTRAVENOUS | Status: DC

## 2014-06-02 MED ORDER — FENTANYL CITRATE 0.05 MG/ML IJ SOLN: 25.0000 ug | INTRAMUSCULAR | Status: DC | PRN

## 2014-06-02 MED ORDER — ATROPINE SULFATE 0.4 MG/ML IJ SOLN
INTRAMUSCULAR | Status: AC
Start: 2014-06-02 — End: 2014-06-02
  Filled 2014-06-02: qty 1

## 2014-06-02 MED ORDER — ROCURONIUM BROMIDE 100 MG/10ML IV SOLN
INTRAVENOUS | Status: AC
  Filled 2014-06-02: qty 1

## 2014-06-02 MED ORDER — ACETAMINOPHEN 325 MG PO TABS: 650.0000 mg | ORAL_TABLET | ORAL | Status: DC | PRN

## 2014-06-02 MED ORDER — VANCOMYCIN HCL IN DEXTROSE 1-5 GM/200ML-% IV SOLN
INTRAVENOUS | Status: AC
  Filled 2014-06-02: qty 200

## 2014-06-02 MED ORDER — CEFAZOLIN SODIUM 1-5 GM-% IV SOLN: 1.0000 g | Freq: Three times a day (TID) | INTRAVENOUS | Status: DC

## 2014-06-02 MED ORDER — NEOSTIGMINE METHYLSULFATE 10 MG/10ML IV SOLN
INTRAVENOUS | Status: AC
  Filled 2014-06-02: qty 1

## 2014-06-02 MED ORDER — LIDOCAINE HCL (CARDIAC) 20 MG/ML IV SOLN
INTRAVENOUS | Status: AC
  Filled 2014-06-02: qty 5

## 2014-06-02 MED ORDER — DEXAMETHASONE SODIUM PHOSPHATE 10 MG/ML IJ SOLN
INTRAMUSCULAR | Status: AC
  Filled 2014-06-02: qty 1

## 2014-06-02 MED ORDER — FENTANYL CITRATE 0.05 MG/ML IJ SOLN
INTRAMUSCULAR | Status: AC
  Filled 2014-06-02: qty 2

## 2014-06-02 MED ORDER — THROMBIN 5000 UNITS EX SOLR
OROMUCOSAL | Status: DC | PRN
  Administered 2014-06-02: 10:00:00 via TOPICAL

## 2014-06-02 MED ORDER — BUPIVACAINE-EPINEPHRINE (PF) 0.5% -1:200000 IJ SOLN
INTRAMUSCULAR | Status: DC | PRN
  Administered 2014-06-02: 20 mL

## 2014-06-02 MED ORDER — PROMETHAZINE HCL 25 MG/ML IJ SOLN: 6.2500 mg | INTRAMUSCULAR | Status: DC | PRN

## 2014-06-02 MED ORDER — ONDANSETRON HCL 4 MG/2ML IJ SOLN
INTRAMUSCULAR | Status: DC | PRN
Start: 2014-06-02 — End: 2014-06-02
  Administered 2014-06-02: 4 mg via INTRAVENOUS

## 2014-06-02 MED ORDER — CISATRACURIUM BESYLATE 20 MG/10ML IV SOLN
INTRAVENOUS | Status: AC
  Filled 2014-06-02: qty 10

## 2014-06-02 MED ORDER — SODIUM CHLORIDE 0.9 % IR SOLN
Freq: Once | Status: AC
  Administered 2014-06-02: 10:00:00
  Filled 2014-06-02: qty 1

## 2014-06-02 MED ORDER — BUPIVACAINE LIPOSOME 1.3 % IJ SUSP
20.0000 mL | Freq: Once | INTRAMUSCULAR | Status: AC
  Administered 2014-06-02: 20 mL
  Filled 2014-06-02: qty 20

## 2014-06-02 MED ORDER — LIDOCAINE HCL (PF) 2 % IJ SOLN
INTRAMUSCULAR | Status: DC | PRN
  Administered 2014-06-02: 75 mg via INTRADERMAL

## 2014-06-02 MED ORDER — BISACODYL 5 MG PO TBEC: 5.0000 mg | DELAYED_RELEASE_TABLET | Freq: Every day | ORAL | Status: DC | PRN

## 2014-06-02 MED ORDER — METHOCARBAMOL 500 MG PO TABS: 500.0000 mg | ORAL_TABLET | Freq: Four times a day (QID) | ORAL | Status: DC | PRN

## 2014-06-02 MED ORDER — EPHEDRINE SULFATE 50 MG/ML IJ SOLN
INTRAMUSCULAR | Status: AC
  Filled 2014-06-02: qty 1

## 2014-06-02 MED ORDER — FLEET ENEMA 7-19 GM/118ML RE ENEM: 1.0000 | ENEMA | Freq: Once | RECTAL | Status: AC | PRN

## 2014-06-02 MED ORDER — CEFAZOLIN SODIUM-DEXTROSE 2-3 GM-% IV SOLR
2.0000 g | INTRAVENOUS | Status: DC
Start: 2014-06-02 — End: 2014-06-02

## 2014-06-02 MED ORDER — ONDANSETRON HCL 4 MG/2ML IJ SOLN: 4.0000 mg | INTRAMUSCULAR | Status: DC | PRN

## 2014-06-02 MED ORDER — THROMBIN 5000 UNITS EX SOLR
CUTANEOUS | Status: DC | PRN
  Administered 2014-06-02: 10:00:00 via TOPICAL

## 2014-06-02 MED ORDER — ACETAMINOPHEN 650 MG RE SUPP: 650.0000 mg | RECTAL | Status: DC | PRN

## 2014-06-02 MED ORDER — POLYMYXIN B SULFATE 500000 UNITS IJ SOLR
INTRAMUSCULAR | Status: AC
  Filled 2014-06-02: qty 1

## 2014-06-02 MED ORDER — OXYCODONE-ACETAMINOPHEN 5-325 MG PO TABS: 1.0000 | ORAL_TABLET | ORAL | Status: DC | PRN

## 2014-06-02 MED ORDER — METHOCARBAMOL 1000 MG/10ML IJ SOLN
500.0000 mg | Freq: Four times a day (QID) | INTRAVENOUS | Status: DC | PRN
  Administered 2014-06-02: 500 mg via INTRAVENOUS
  Filled 2014-06-02: qty 5

## 2014-06-02 MED ORDER — METHOCARBAMOL 500 MG PO TABS
500.0000 mg | ORAL_TABLET | Freq: Four times a day (QID) | ORAL | Status: DC | PRN
Start: 2014-06-02 — End: 2014-06-03

## 2014-06-02 MED ORDER — GLYCOPYRROLATE 0.2 MG/ML IJ SOLN
INTRAMUSCULAR | Status: AC
  Filled 2014-06-02: qty 3

## 2014-06-02 MED ORDER — THROMBIN 5000 UNITS EX SOLR
CUTANEOUS | Status: AC
  Filled 2014-06-02: qty 10000

## 2014-06-02 MED ORDER — ROCURONIUM BROMIDE 100 MG/10ML IV SOLN
INTRAVENOUS | Status: DC | PRN
  Administered 2014-06-02: 20 mg via INTRAVENOUS
  Administered 2014-06-02: 60 mg via INTRAVENOUS
  Administered 2014-06-02: 20 mg via INTRAVENOUS

## 2014-06-02 MED ORDER — MIDAZOLAM HCL 5 MG/5ML IJ SOLN
INTRAMUSCULAR | Status: DC | PRN
  Administered 2014-06-02: 2 mg via INTRAVENOUS

## 2014-06-02 MED ORDER — MENTHOL 3 MG MT LOZG
1.0000 | LOZENGE | OROMUCOSAL | Status: DC | PRN
  Filled 2014-06-02: qty 9

## 2014-06-02 MED ORDER — KETAMINE HCL 10 MG/ML IJ SOLN
INTRAMUSCULAR | Status: AC
  Filled 2014-06-02: qty 1

## 2014-06-02 MED ORDER — BACITRACIN-NEOMYCIN-POLYMYXIN 400-5-5000 EX OINT
TOPICAL_OINTMENT | CUTANEOUS | Status: DC | PRN
Start: 2014-06-02 — End: 2014-06-02
  Administered 2014-06-02: 1 via TOPICAL

## 2014-06-02 MED ORDER — PROPOFOL 10 MG/ML IV BOLUS
INTRAVENOUS | Status: AC
  Filled 2014-06-02: qty 20

## 2014-06-02 MED ORDER — MEPERIDINE HCL 50 MG/ML IJ SOLN: 6.2500 mg | INTRAMUSCULAR | Status: DC | PRN

## 2014-06-02 MED ORDER — FENTANYL CITRATE 0.05 MG/ML IJ SOLN
INTRAMUSCULAR | Status: DC | PRN
  Administered 2014-06-02: 100 ug via INTRAVENOUS
  Administered 2014-06-02: 50 ug via INTRAVENOUS
  Administered 2014-06-02: 100 ug via INTRAVENOUS
  Administered 2014-06-02 (×3): 50 ug via INTRAVENOUS

## 2014-06-02 MED ORDER — HYDROMORPHONE HCL PF 1 MG/ML IJ SOLN: 0.5000 mg | INTRAMUSCULAR | Status: DC | PRN

## 2014-06-02 MED ORDER — DSS 100 MG PO CAPS: 100.0000 mg | ORAL_CAPSULE | Freq: Two times a day (BID) | ORAL | Status: DC

## 2014-06-02 MED ORDER — GLYCOPYRROLATE 0.2 MG/ML IJ SOLN
INTRAMUSCULAR | Status: DC | PRN
  Administered 2014-06-02: 0.6 mg via INTRAVENOUS

## 2014-06-02 MED ORDER — SODIUM CHLORIDE 0.9 % IV SOLN
1500.0000 mg | Freq: Once | INTRAVENOUS | Status: AC
  Administered 2014-06-02: 1500 mg via INTRAVENOUS
  Filled 2014-06-02: qty 1500

## 2014-06-02 MED ORDER — PROPOFOL 10 MG/ML IV BOLUS
INTRAVENOUS | Status: DC | PRN
  Administered 2014-06-02: 200 mg via INTRAVENOUS

## 2014-06-02 MED ORDER — NEOSTIGMINE METHYLSULFATE 10 MG/10ML IV SOLN
INTRAVENOUS | Status: DC | PRN
  Administered 2014-06-02: 4 mg via INTRAVENOUS

## 2014-06-02 MED ORDER — VANCOMYCIN HCL IN DEXTROSE 1-5 GM/200ML-% IV SOLN
1000.0000 mg | Freq: Once | INTRAVENOUS | Status: AC
  Administered 2014-06-02: 1000 mg via INTRAVENOUS

## 2014-06-02 MED ORDER — HYDROCODONE-ACETAMINOPHEN 5-325 MG PO TABS: 1.0000 | ORAL_TABLET | ORAL | Status: DC | PRN

## 2014-06-02 MED ORDER — LACTATED RINGERS IV SOLN
INTRAVENOUS | Status: DC
  Administered 2014-06-02: 1000 mL via INTRAVENOUS
  Administered 2014-06-02: 10:00:00 via INTRAVENOUS

## 2014-06-02 MED ORDER — DEXAMETHASONE SODIUM PHOSPHATE 10 MG/ML IJ SOLN
INTRAMUSCULAR | Status: DC | PRN
  Administered 2014-06-02: 10 mg via INTRAVENOUS

## 2014-06-02 MED ORDER — SODIUM CHLORIDE 0.9 % IJ SOLN
INTRAMUSCULAR | Status: AC
  Filled 2014-06-02: qty 10

## 2014-06-02 MED ORDER — DOCUSATE SODIUM 100 MG PO CAPS
100.0000 mg | ORAL_CAPSULE | Freq: Two times a day (BID) | ORAL | Status: DC
  Administered 2014-06-03 (×2): 100 mg via ORAL

## 2014-06-02 MED ORDER — PHENOL 1.4 % MT LIQD: 1.0000 | OROMUCOSAL | Status: DC | PRN

## 2014-06-02 MED ORDER — MIDAZOLAM HCL 2 MG/2ML IJ SOLN
INTRAMUSCULAR | Status: AC
  Filled 2014-06-02: qty 2

## 2014-06-02 MED ORDER — BACITRACIN-NEOMYCIN-POLYMYXIN 400-5-5000 EX OINT
TOPICAL_OINTMENT | CUTANEOUS | Status: AC
  Filled 2014-06-02: qty 1

## 2014-06-02 MED ORDER — POLYETHYLENE GLYCOL 3350 17 G PO PACK: 17.0000 g | PACK | Freq: Every day | ORAL | Status: DC | PRN

## 2014-06-02 MED ORDER — ONDANSETRON HCL 4 MG/2ML IJ SOLN
INTRAMUSCULAR | Status: AC
  Filled 2014-06-02: qty 2

## 2014-06-02 MED ORDER — FENTANYL CITRATE 0.05 MG/ML IJ SOLN
INTRAMUSCULAR | Status: AC
  Filled 2014-06-02: qty 5

## 2014-06-02 MED ORDER — EPHEDRINE SULFATE 50 MG/ML IJ SOLN
INTRAMUSCULAR | Status: DC | PRN
  Administered 2014-06-02: 5 mg via INTRAVENOUS

## 2014-06-02 SURGICAL SUPPLY — 39 items
BAG ZIPLOCK 12X15 (MISCELLANEOUS) IMPLANT
CLEANER TIP ELECTROSURG 2X2 (MISCELLANEOUS) ×3 IMPLANT
CONT SPECI 4OZ STER CLIK (MISCELLANEOUS) ×3 IMPLANT
DRAIN PENROSE 18X1/4 LTX STRL (WOUND CARE) IMPLANT
DRAPE LG THREE QUARTER DISP (DRAPES) ×3 IMPLANT
DRAPE MICROSCOPE LEICA (MISCELLANEOUS) ×3 IMPLANT
DRAPE POUCH INSTRU U-SHP 10X18 (DRAPES) ×3 IMPLANT
DRAPE SURG 17X11 SM STRL (DRAPES) ×3 IMPLANT
DRSG ADAPTIC 3X8 NADH LF (GAUZE/BANDAGES/DRESSINGS) ×3 IMPLANT
DRSG PAD ABDOMINAL 8X10 ST (GAUZE/BANDAGES/DRESSINGS) ×3 IMPLANT
DURAPREP 26ML APPLICATOR (WOUND CARE) ×3 IMPLANT
ELECT BLADE TIP CTD 4 INCH (ELECTRODE) ×3 IMPLANT
ELECT REM PT RETURN 9FT ADLT (ELECTROSURGICAL) ×3
ELECTRODE REM PT RTRN 9FT ADLT (ELECTROSURGICAL) ×1 IMPLANT
GLOVE BIOGEL PI IND STRL 8 (GLOVE) ×1 IMPLANT
GLOVE BIOGEL PI INDICATOR 8 (GLOVE) ×2
GLOVE ECLIPSE 8.0 STRL XLNG CF (GLOVE) ×6 IMPLANT
GLOVE SURG SS PI 8.0 STRL IVOR (GLOVE) ×3 IMPLANT
GOWN STRL REUS W/TWL XL LVL3 (GOWN DISPOSABLE) ×6 IMPLANT
KIT BASIN OR (CUSTOM PROCEDURE TRAY) ×3 IMPLANT
KIT POSITIONING SURG ANDREWS (MISCELLANEOUS) ×3 IMPLANT
MANIFOLD NEPTUNE II (INSTRUMENTS) ×3 IMPLANT
NEEDLE SPNL 18GX3.5 QUINCKE PK (NEEDLE) ×9 IMPLANT
PATTIES SURGICAL .5 X.5 (GAUZE/BANDAGES/DRESSINGS) IMPLANT
PATTIES SURGICAL .75X.75 (GAUZE/BANDAGES/DRESSINGS) IMPLANT
PATTIES SURGICAL 1X1 (DISPOSABLE) IMPLANT
PIN SAFETY NICK PLATE  2 MED (MISCELLANEOUS)
PIN SAFETY NICK PLATE 2 MED (MISCELLANEOUS) IMPLANT
SPONGE LAP 4X18 X RAY DECT (DISPOSABLE) ×9 IMPLANT
SPONGE SURGIFOAM ABS GEL 100 (HEMOSTASIS) ×3 IMPLANT
STAPLER VISISTAT 35W (STAPLE) ×3 IMPLANT
SUT VIC AB 0 CT1 27 (SUTURE) ×2
SUT VIC AB 0 CT1 27XBRD ANTBC (SUTURE) ×1 IMPLANT
SUT VIC AB 1 CT1 27 (SUTURE) ×4
SUT VIC AB 1 CT1 27XBRD ANTBC (SUTURE) ×2 IMPLANT
SYRINGE 20CC LL (MISCELLANEOUS) ×3 IMPLANT
TOWEL OR 17X26 10 PK STRL BLUE (TOWEL DISPOSABLE) ×3 IMPLANT
TOWEL OR NON WOVEN STRL DISP B (DISPOSABLE) ×3 IMPLANT
TRAY LAMINECTOMY (CUSTOM PROCEDURE TRAY) ×3 IMPLANT

## 2014-06-02 NOTE — Interval H&P Note (Signed)
History and Physical Interval Note:  06/02/2014 8:05 AM  Cameron Young  has presented today for surgery, with the diagnosis of HERNIATED DISC  The various methods of treatment have been discussed with the patient and family. After consideration of risks, benefits and other options for treatment, the patient has consented to  Procedure(s): HEMI- LAMINECTOMY  L4-L5 RIGHT      (LEVEL 1) (Right) as a surgical intervention .  The patient's history has been reviewed, patient examined, no change in status, stable for surgery.  I have reviewed the patient's chart and labs.  Questions were answered to the patient's satisfaction.     [name] A

## 2014-06-02 NOTE — Brief Op Note (Signed)
06/02/2014  11:08 AM  PATIENT:  Kayleen Memos Delapena  68 y.o. 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  POST-OPERATIVE DIAGNOSIS: Same as Pre-Op  PROCEDURE:  Procedure(s): LUMBAR LAMINECTOMY,CENTRAL  DECOMPRESSION, MICRODISCECTOMY, FORAMINOTOMY  LUMBAR FOUR TO LUMBAR FIVE RIGHT (Right) for Spinal Stenosis and Herniated Lumbar Disc.Foraminotomies for L-4 and L-5 Nerve.Ecision of Synovial Cyst at L-4- -5 on the right. Roots on the right.  SURGEON:  Surgeon(s) and Role:    * Tobi Bastos, MD - Primary    * Magnus Sinning, MD - Assisting    ASSISTANTS:James Aplington MD  ANESTHESIA:   general  EBL:  Total I/O In: 1000 [I.V.:1000] Out: 50 [Blood:50]  BLOOD ADMINISTERED:none  DRAINS: none   LOCAL MEDICATIONS USED:  MARCAINE 20cc of 0.50% with Epinephrine at start of case and 20cc of Exparel at the end of the case.    SPECIMEN:  Source of Specimen:  L-4-L-5  DISPOSITION OF SPECIMEN:  PATHOLOGY  COUNTS:  YES  TOURNIQUET:  * No tourniquets in log *  DICTATION: .Other Dictation: Dictation Number [id number]  PLAN OF CARE: Admit for overnight observation  PATIENT DISPOSITION:  PACU - hemodynamically stable.   Delay start of Pharmacological VTE agent (>24hrs) due to surgical blood loss or risk of bleeding: yes

## 2014-06-02 NOTE — Progress Notes (Deleted)
Subjective: Day of Surgery Procedure(s) (LRB): HEMI- LAMINECTOMY  L4-L5 RIGHT      (LEVEL 1) (Right) Patient reports pain as 4 on 0-10 scaleDoing well. Will DC.   Objective: Vital signs in last 24 hours: Temp:  [98.2 F (36.8 C)] 98.2 F (36.8 C) (08/21 5732) Pulse Rate:  [85] 85 (08/21 0633) Resp:  [18] 18 (08/21 0633) BP: (166)/(87) 166/87 mmHg (08/21 0633) SpO2:  [97 %] 97 % (08/21 0633) Weight:  [110.678 kg (244 lb)] 110.678 kg (244 lb) (08/21 0634)  Intake/Output from previous day:   Intake/Output this shift:     Recent Labs  05/30/14 1345  HGB 14.6    Recent Labs  05/30/14 1345  WBC 8.1  RBC 4.77  HCT 43.2  PLT 207    Recent Labs  05/30/14 1345  NA 139  K 4.1  CL 101  CO2 24  BUN 13  CREATININE 1.05  GLUCOSE 130*  CALCIUM 9.7    Recent Labs  05/30/14 1345  INR 1.05    Neurologically intact  Assessment/Plan: Day of Surgery Procedure(s) (LRB): HEMI- LAMINECTOMY  L4-L5 RIGHT      (LEVEL 1) (Right) Discharge home with home health  Crisanto Nied A 06/02/2014, 7:29 AM

## 2014-06-02 NOTE — Anesthesia Preprocedure Evaluation (Signed)
Anesthesia Evaluation  Patient identified by MRN, date of birth, ID band Patient awake    Reviewed: Allergy & Precautions, H&P , NPO status , Patient's Chart, lab work & pertinent test results  Airway Mallampati: II TM Distance: >3 FB Neck ROM: Full    Dental no notable dental hx. (+) Caps   Pulmonary neg pulmonary ROS,  breath sounds clear to auscultation  Pulmonary exam normal       Cardiovascular DVT Rhythm:Regular Rate:Normal     Neuro/Psych negative neurological ROS  negative psych ROS   GI/Hepatic negative GI ROS, Neg liver ROS,   Endo/Other  negative endocrine ROS  Renal/GU negative Renal ROS  negative genitourinary   Musculoskeletal negative musculoskeletal ROS (+)   Abdominal   Peds negative pediatric ROS (+)  Hematology negative hematology ROS (+)   Anesthesia Other Findings   Reproductive/Obstetrics negative OB ROS                           Anesthesia Physical Anesthesia Plan  ASA: II  Anesthesia Plan: General   Post-op Pain Management:    Induction: Intravenous  Airway Management Planned: Oral ETT  Additional Equipment:   Intra-op Plan:   Post-operative Plan: Extubation in OR  Informed Consent: I have reviewed the patients History and Physical, chart, labs and discussed the procedure including the risks, benefits and alternatives for the proposed anesthesia with the patient or authorized representative who has indicated his/her understanding and acceptance.   Dental advisory given  Plan Discussed with: CRNA  Anesthesia Plan Comments:         Anesthesia Quick Evaluation

## 2014-06-02 NOTE — Transfer of Care (Signed)
Immediate Anesthesia Transfer of Care Note  Patient: Cameron Young  Procedure(s) Performed: Procedure(s): LUMBAR LAMINECTOMY,CENTRAL  DECOMPRESSION, MICRODISCECTOMY, FORAMINOTOMY  LUMBAR FOUR TO LUMBAR FIVE RIGHT (Right)  Patient Location: PACU  Anesthesia Type:General  Level of Consciousness: awake, sedated and responds to stimulation  Airway & Oxygen Therapy: Patient Spontanous Breathing and Patient connected to face mask oxygen  Post-op Assessment: Report given to PACU RN and Post -op Vital signs reviewed and stable  Post vital signs: Reviewed and stable  Complications: No apparent anesthesia complications

## 2014-06-02 NOTE — Discharge Instructions (Signed)
Change your dressing daily. °Shower only, no tub bath. °Call if any temperatures greater than 101 or any wound complications: [phone] during the day and ask for Dr. Gioffre's nurse, Tammy Johnson. °

## 2014-06-02 NOTE — Anesthesia Postprocedure Evaluation (Signed)
  Anesthesia Post-op Note  Patient: Cameron Young  Procedure(s) Performed: Procedure(s) (LRB): LUMBAR LAMINECTOMY,CENTRAL  DECOMPRESSION, MICRODISCECTOMY, FORAMINOTOMY  LUMBAR FOUR TO LUMBAR FIVE RIGHT (Right)  Patient Location: PACU  Anesthesia Type: General  Level of Consciousness: awake and alert   Airway and Oxygen Therapy: Patient Spontanous Breathing  Post-op Pain: mild  Post-op Assessment: Post-op Vital signs reviewed, Patient's Cardiovascular Status Stable, Respiratory Function Stable, Patent Airway and No signs of Nausea or vomiting  Last Vitals:  Filed Vitals:   06/02/14 1209  BP: 145/75  Pulse: 88  Temp: 36.7 C  Resp: 15    Post-op Vital Signs: stable   Complications: No apparent anesthesia complications

## 2014-06-03 DIAGNOSIS — M5126 Other intervertebral disc displacement, lumbar region: Secondary | ICD-10-CM | POA: Diagnosis not present

## 2014-06-03 NOTE — Evaluation (Signed)
Physical Therapy Evaluation Patient Details Name: Cameron Young MRN: [id number] DOB: 15-Oct-1945 Today's Date: 06/03/2014   History of Present Illness  LUMBAR LAMINECTOMY,CENTRAL  DECOMPRESSION, MICRODISCECTOMY, FORAMINOTOMY  LUMBAR FOUR TO LUMBAR FIVE RIGHT on 06/02/2014  Clinical Impression  Pt very motivated and mobilizing at Sup level with RW.  Pt educated on back precautions with written information provided and eager for d/c home this date.    Follow Up Recommendations No PT follow up    Equipment Recommendations  None recommended by PT    Recommendations for Other Services       Precautions / Restrictions Precautions Precautions: Back Precaution Booklet Issued: Yes (comment) Restrictions Weight Bearing Restrictions: No      Mobility  Bed Mobility Overal bed mobility: Needs Assistance Bed Mobility: Rolling;Supine to Sit Rolling: Supervision Sidelying to sit: Min guard;Supervision Supine to sit: Supervision Sit to supine: Min guard;Supervision Sit to sidelying: Min guard;Supervision General bed mobility comments: verbal cues for technique  Transfers Overall transfer level: Needs assistance Equipment used: Rolling walker (2 wheeled) Transfers: Sit to/from Stand Sit to Stand: Supervision         General transfer comment: verbal cues for hand placement   Ambulation/Gait Ambulation/Gait assistance: Min guard;Supervision Ambulation Distance (Feet): 400 Feet Assistive device: Rolling walker (2 wheeled) Gait Pattern/deviations: Step-through pattern;Decreased step length - right;Decreased step length - left;Shuffle;Trunk flexed     General Gait Details: cues for posture and position from RW  Stairs Stairs: Yes Stairs assistance: Min assist Stair Management: One rail Right;Step to pattern;Forwards Number of Stairs: 4    Wheelchair Mobility    Modified Rankin (Stroke Patients Only)       Balance                                              Pertinent Vitals/Pain Pain Assessment: 0-10 Pain Score: 2  Pain Location: back    Home Living Family/patient expects to be discharged to:: Private residence Living Arrangements: Spouse/significant other Available Help at Discharge: Family Type of Home: House Home Access: Stairs to enter Entrance Stairs-Rails: Psychiatric nurse of Steps: 6 Home Layout: One level Home Equipment: Cane - single point Additional Comments: Pt states he will borrow RW from sister    Prior Function Level of Independence: Independent               Hand Dominance        Extremity/Trunk Assessment   Upper Extremity Assessment: Overall WFL for tasks assessed           Lower Extremity Assessment: Overall WFL for tasks assessed (Pt notes improvement in R partial drop foot since surgery)         Communication   Communication: No difficulties  Cognition Arousal/Alertness: Awake/alert Behavior During Therapy: WFL for tasks assessed/performed Overall Cognitive Status: Within Functional Limits for tasks assessed                      General Comments      Exercises        Assessment/Plan    PT Assessment Patient needs continued PT services  PT Diagnosis Difficulty walking   PT Problem List Decreased strength;Decreased range of motion;Decreased activity tolerance;Decreased mobility;Decreased knowledge of use of DME;Pain  PT Treatment Interventions DME instruction;Gait training;Stair training;Functional mobility training;Therapeutic activities;Patient/family education   PT Goals (Current goals can be found  in the Care Plan section) Acute Rehab PT Goals 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 PT Goal Formulation: No goals set, d/c therapy    Frequency     Barriers to discharge        Co-evaluation               End of Session   Activity Tolerance: Patient tolerated treatment well Patient left: in chair;with call bell/phone within  reach;with family/visitor present Nurse Communication: Mobility status    Functional Assessment Tool Used: Clinical judgement Functional Limitation: Mobility: Walking and moving around Mobility: Walking and Moving Around Current Status (F1219): At least 1 percent but less than 20 percent impaired, limited or restricted Mobility: Walking and Moving Around Goal Status [phone]): At least 1 percent but less than 20 percent impaired, limited or restricted Mobility: Walking and Moving Around Discharge Status [phone]): At least 1 percent but less than 20 percent impaired, limited or restricted    Time: 0957-1024 PT Time Calculation (min): 27 min   Charges:   PT Evaluation $Initial PT Evaluation Tier I: 1 Procedure PT Treatments $Gait Training: 8-22 mins   PT G Codes:   Functional Assessment Tool Used: Clinical judgement Functional Limitation: Mobility: Walking and moving around    [facility] 06/03/2014, 11:53 AM

## 2014-06-03 NOTE — Op Note (Signed)
NAME:  OZ, GAMMEL                 ACCOUNT NO.:  [id number]  MEDICAL RECORD NO.:  [id number]  LOCATION:  [facility]                         FACILITY:  [facility]  PHYSICIAN:  Kipp Brood. Myliah Medel, M.D.DATE OF BIRTH:  October 18, 1945  DATE OF PROCEDURE:  06/02/2014 DATE OF DISCHARGE:                              OPERATIVE REPORT   PREOPERATIVE DIAGNOSES: 1. Spinal stenosis at L4-5. 2. Herniated lumbar disk at L4-5 on the right. 3. Partial footdrop on the right secondary to the above. 4. Foraminal stenosis of the L4 root. 5. Foraminal stenosis of the L5 root on the right.  POSTOPERATIVE DIAGNOSES: 1. Spinal stenosis at L4-5. 2. Herniated lumbar disk at L4-5 on the right. 3. Partial footdrop on the right secondary to the above. 4. Foraminal stenosis of the L4 root. 5. Foraminal stenosis of the L5 root on the right.  OPERATION: 1. Central decompressive lumbar laminectomy at L4-5 for spinal     stenosis. 2. Microdiskectomy at L4-5 on the right for herniated lumbar disk. 3. Foraminotomies for the L4 root on the right. 4. Foraminotomy for the L5 root on the right.  SURGEON:  Kipp Brood. Gladstone Lighter, M.D.  ASSISTANT:  Tarri Glenn, M.D.  DESCRIPTION OF PROCEDURE:  Under general anesthesia, routine orthopedic prep and draping of the lower back was carried out.  The appropriate time-out was first carried out.  Two needles were placed in the back for localization purposes.  Also, I want to state that I did mark the right side of his back in the holding area to that is were his main symptoms were.  After the 2 needles were placed in the back, x-ray was taken to verify the position.  Incision then was made over the L4-5 interspace. Bleeders were identified and cauterized.  I then stripped the muscle from the spinous process and lamina of L4-L5 region.  Another x-ray was taken with Kocher clamps in place.  At this time, we had rather severe shingling-type effect of the L4-5 space, so I decided to go  central and to the right.  We did do exactly that, we removed the spinous process of L4.  We went down, took another x-ray with instruments L4-5.  At that time, the microscope was brought in and we then proceeded with our laminectomy proximally and distally, removed the ligamentum flavum. Note, there was a large synovial cyst at L4-5 on the right.  We gently removed that first, then went out did a partial facetectomy, went down did a nice decompression of the lateral recess.  We went out the foramina for the L4 root and the L5 root to make sure we had first good exposure because the synovial cyst scarred things down in this area.  We then gently removed the ligamentum flavum from the dura and we did use the microscope.  We identified the L5 root which was extremely swollen. We gently retracted that and then identified the L4-5 disk.  At that time, a cruciate incision was made at the L4-5 disk space and a large fragment of the disk was removed utilizing the nerve hook.  We then searched for other fragments.  There were no other fragments noted.  We  were able to easily pass the hockey-stick now up the foramina of the 4 root to 5 root as we did the foraminotomies.  The dura and the nerve root was now more freely movable.  There were no other fragments noted. The nerve root was thoroughly decompressed.  We irrigated the area and then loosely applied thrombin-soaked Gelfoam and closed the wound in the usual fashion.  Note, I did leave a small deep distal and proximal part of the wound open for drainage purposes.  Subcu was closed with #1 Vicryl.  Prior to closing the subcu, I injected 20 mL of Exparel into the muscle and soft tissue.  The skin that was closed with metal staples and a sterile dressing was applied.  He did have 2 g of IV Ancef preop.          ______________________________ Kipp Brood. Gladstone Lighter, M.D.     RAG/MEDQ  D:  06/02/2014  T:  06/02/2014  Job:  [id number]

## 2014-06-03 NOTE — Evaluation (Signed)
Occupational Therapy Evaluation Patient Details Name: Cameron Young MRN: [id number] DOB: Sep 13, 1946 Today's Date: 06/03/2014    History of Present Illness LUMBAR LAMINECTOMY,CENTRAL  DECOMPRESSION, MICRODISCECTOMY, FORAMINOTOMY  LUMBAR FOUR TO LUMBAR FIVE RIGHT on 06/02/2014   Clinical Impression   Pt presents to OT s/p back surgery. Education complete regarding ADL Activity and back precautions.  Pt doing well!    Follow Up Recommendations  No OT follow up    Equipment Recommendations  None recommended by OT       Precautions / Restrictions Precautions Precautions: Back Restrictions Weight Bearing Restrictions: No      Mobility Bed Mobility Overal bed mobility: Needs Assistance Bed Mobility: Rolling;Supine to Sit Rolling: Supervision   Supine to sit: Supervision     General bed mobility comments: verbal cues for technique  Transfers Overall transfer level: Needs assistance Equipment used: Rolling walker (2 wheeled) Transfers: Sit to/from Stand Sit to Stand: Supervision         General transfer comment: verbal cues for hand placement          ADL Overall ADL's : Needs assistance/impaired                     Lower Body Dressing: Min guard;Sit to/from stand;Cueing for back precautions   Toilet Transfer: Supervision/safety;Ambulation;Comfort height toilet   Toileting- Clothing Manipulation and Hygiene: Supervision/safety;Sit to/from stand         General ADL Comments: Pt overall S- min S with ADL activity . Educated on back precautions s/p back surgery. Pt has a shoe horn and will obtain a reacher if needed.               Pertinent Vitals/Pain Pain Assessment: 0-10 Pain Score: 2      Hand Dominance     Extremity/Trunk Assessment Upper Extremity Assessment Upper Extremity Assessment: Overall WFL for tasks assessed           Communication Communication Communication: No difficulties   Cognition Arousal/Alertness:  Awake/alert Behavior During Therapy: WFL for tasks assessed/performed Overall Cognitive Status: Within Functional Limits for tasks assessed                     General Comments   pt doing very well!            Home Living Family/patient expects to be discharged to:: Private residence Living Arrangements: Spouse/significant other Available Help at Discharge: Family Type of Home: House Home Access: Stairs to enter Technical brewer of Steps: 6 Entrance Stairs-Rails: Right;Left Home Layout: One level     Bathroom Shower/Tub: Walk-in shower;Tub/shower unit Shower/tub characteristics: Door Biochemist, clinical: Standard     Home Equipment: [employer] - single point          Prior Functioning/Environment Level of Independence: Independent                      OT Goals(Current goals can be found in the care plan section) Acute Rehab OT Goals 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 OT Goal Formulation: With patient   End of Session    Activity Tolerance: Patient tolerated treatment well Patient left: Other (comment) (with PT)   Time: 5400-8676 OT Time Calculation (min): 30 min Charges:  OT General Charges $OT Visit: 1 Procedure OT Evaluation $Initial OT Evaluation Tier I: 1 Procedure OT Treatments $Self Care/Home Management : 23-37 mins G-Codes: OT G-codes **NOT FOR INPATIENT CLASS** Functional Assessment Tool Used: clinical observation Functional Limitation: Self care  Self Care Current Status [phone]): At least 1 percent but less than 20 percent impaired, limited or restricted Self Care Goal Status (W9295): At least 1 percent but less than 20 percent impaired, limited or restricted Self Care Discharge Status [phone]): At least 20 percent but less than 40 percent impaired, limited or restricted  [place], Thereasa Parkin 06/03/2014, 10:12 AM

## 2014-06-03 NOTE — Progress Notes (Signed)
Pt discharged home; discharge instructions explained to patient and his wife; pt verbalized understanding instructions; pt given a copy of discharge instructions and his prescriptions.

## 2014-06-03 NOTE — Discharge Summary (Signed)
Physician Discharge Summary   Patient ID: Cameron Young MRN: [id number] DOB/AGE: Mar 21, 1946 68 y.o.  Admit date: 06/02/2014 Discharge date: 06/03/2014  Admission Diagnoses:  Active Problems:   Spinal stenosis, lumbar region, with neurogenic claudication   Herniated lumbar intervertebral disc   Discharge Diagnoses:  Same   Surgeries: Procedure(s): LUMBAR LAMINECTOMY,CENTRAL  DECOMPRESSION, MICRODISCECTOMY, FORAMINOTOMY  LUMBAR FOUR TO LUMBAR FIVE RIGHT on 06/02/2014   Consultants: PT/Ot  Discharged Condition: Stable  Hospital Course: Cameron Young is an 68 y.o. male who was admitted 06/02/2014 with a chief complaint of No chief complaint on file. , and found to have a diagnosis of <principal problem not specified>.  They were brought to the operating room on 06/02/2014 and underwent the above named procedures.    The patient had an uncomplicated hospital course and was stable for discharge.  Recent vital signs:  Filed Vitals:   06/03/14 0634  BP: 156/74  Pulse: 84  Temp: 98.4 F (36.9 C)  Resp: 18    Recent laboratory studies:  Results for orders placed during the hospital encounter of 05/30/14  SURGICAL PCR SCREEN      Result Value Ref Range   MRSA, PCR POSITIVE (*) NEGATIVE   Staphylococcus aureus POSITIVE (*) NEGATIVE  CBC      Result Value Ref Range   WBC 8.1  4.0 - 10.5 K/uL   RBC 4.77  4.22 - 5.81 MIL/uL   Hemoglobin 14.6  13.0 - 17.0 g/dL   HCT 43.2  39.0 - 52.0 %   MCV 90.6  78.0 - 100.0 fL   MCH 30.6  26.0 - 34.0 pg   MCHC 33.8  30.0 - 36.0 g/dL   RDW 12.3  11.5 - 15.5 %   Platelets 207  150 - 400 K/uL  APTT      Result Value Ref Range   aPTT 31  24 - 37 seconds  COMPREHENSIVE METABOLIC PANEL      Result Value Ref Range   Sodium 139  137 - 147 mEq/L   Potassium 4.1  3.7 - 5.3 mEq/L   Chloride 101  96 - 112 mEq/L   CO2 24  19 - 32 mEq/L   Glucose, Bld 130 (*) 70 - 99 mg/dL   BUN 13  6 - 23 mg/dL   Creatinine, Ser 1.05  0.50 - 1.35 mg/dL   Calcium 9.7  8.4 - 10.5 mg/dL   Total Protein 7.3  6.0 - 8.3 g/dL   Albumin 3.7  3.5 - 5.2 g/dL   AST 21  0 - 37 U/L   ALT 23  0 - 53 U/L   Alkaline Phosphatase 61  39 - 117 U/L   Total Bilirubin 0.5  0.3 - 1.2 mg/dL   GFR calc non Af Amer 71 (*) >90 mL/min   GFR calc Af Amer 82 (*) >90 mL/min   Anion gap 14  5 - 15  PROTIME-INR      Result Value Ref Range   Prothrombin Time 13.7  11.6 - 15.2 seconds   INR 1.05  0.00 - 1.49  URINALYSIS, ROUTINE W REFLEX MICROSCOPIC      Result Value Ref Range   Color, Urine AMBER (*) YELLOW   APPearance CLEAR  CLEAR   Specific Gravity, Urine 1.026  1.005 - 1.030   pH 5.0  5.0 - 8.0   Glucose, UA NEGATIVE  NEGATIVE mg/dL   Hgb urine dipstick NEGATIVE  NEGATIVE   Bilirubin Urine NEGATIVE  NEGATIVE  Ketones, ur NEGATIVE  NEGATIVE mg/dL   Protein, ur NEGATIVE  NEGATIVE mg/dL   Urobilinogen, UA 1.0  0.0 - 1.0 mg/dL   Nitrite NEGATIVE  NEGATIVE   Leukocytes, UA NEGATIVE  NEGATIVE    Discharge Medications:     Medication List         DSS 100 MG Caps  Take 100 mg by mouth 2 (two) times daily.     methocarbamol 500 MG tablet  Commonly known as:  ROBAXIN  Take 1 tablet (500 mg total) by mouth every 6 (six) hours as needed for muscle spasms.     oxyCODONE-acetaminophen 5-325 MG per tablet  Commonly known as:  PERCOCET/ROXICET  Take 1-2 tablets by mouth every 4 (four) hours as needed for moderate pain.        Diagnostic Studies: Dg Chest 2 View  05/30/2014   CLINICAL DATA:  Preoperative evaluation for L4-L5 laminectomy, borderline diabetes  EXAM: CHEST  2 VIEW  COMPARISON:  None  FINDINGS: Normal heart size, mediastinal contours and pulmonary vascularity.  Minimal peribronchial thickening.  Lungs otherwise clear.  No pleural effusion or pneumothorax.  No acute osseous findings.  IMPRESSION: Minimal bronchitic changes.   Electronically Signed   By: Lavonia Dana M.D.   On: 05/30/2014 14:27   Dg Lumbar Spine 2-3 Views  05/30/2014   CLINICAL  DATA:  Preoperative evaluation for L4-L5 laminectomy  EXAM: LUMBAR SPINE - 2-3 VIEW  COMPARISON:  CT lumbar myelography 05/19/2014  FINDINGS: Five lumbar vertebrae.  Minimal endplate spur formation at multiple levels in lumbar spine.  Vertebral body and disc space heights maintained.  No acute fracture or subluxation.  Bones appear mildly demineralized.  No gross evidence of spondylolysis or bone destruction.  SI joints preserved.  IMPRESSION: No acute abnormalities.  Minimal degenerative disc disease changes.   Electronically Signed   By: Lavonia Dana M.D.   On: 05/30/2014 14:32   Ct Lumbar Spine W Contrast  05/19/2014   CLINICAL DATA:  RIGHT hip pain. 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 Lumbar spinal stenosis. RIGHT-sided sciatic up.  EXAM: LUMBAR MYELOGRAM  FLUOROSCOPY TIME:  0 min 28 seconds  PROCEDURE: After thorough discussion of risks and benefits of the procedure including bleeding, infection, injury to nerves, blood vessels, adjacent structures as well as headache and CSF leak, written and oral informed consent was obtained. Consent was obtained by Dr. Dereck Ligas. Time out form was completed.  Patient was positioned prone on the fluoroscopy table. Local anesthesia was provided with 1% lidocaine without epinephrine after prepped and draped in the usual sterile fashion. Puncture was performed at L3-L4 using a 3 1/2 inch 22 gauge pencil point Whitacre spinal needle via RIGHT paramedian approach. Using a single pass through the dura, the needle was placed within the thecal sac, with return of clear CSF. 15 mL of Omnipaque-180 was injected into the thecal sac, with normal opacification of the nerve roots and cauda equina consistent with free flow within the subarachnoid space.  I personally performed the lumbar puncture and administered the intrathecal contrast. I also personally supervised acquisition of the myelogram images.  TECHNIQUE: Contiguous axial images were obtained through the  Lumbar spine after the intrathecal infusion of infusion. Coronal and sagittal reconstructions were obtained of the axial image sets.  COMPARISON:  CT 04/03/2012.  FINDINGS: LUMBAR MYELOGRAM FINDINGS:  L5-S1 predominant disc space loss is present. Shallow extradural impressions are present at L2-L3, L3-L4 and L4-L5. On the prone myelogram images, there is mild mass  effect on the RIGHT L4 nerve as it approaches the lateral recess, with truncation of the nerve root sleeve.  On standing upright images, there is a mild dextroconvex curve with the apex at L3. Straightening of the normal lumbar lordosis on upright lateral view. 2 mm retrolisthesis of L3 on L4 is probably degenerative. The retrolisthesis of L3 on L4 corrects anatomic with flexion. Retrolisthesis is restored with extension. Instability appears minor, without an exacerbation of the disc bulge. L1-L2 shallow disc protrusion is also evident on standing upright images.  CT LUMBAR MYELOGRAM FINDINGS:  Segmentation: 5 lumbar type vertebral bodies.  Alignment: Mild straightening of the normal lumbar lordosis. Mild dextroconvex curve with the apex at L2. Compensatory lower levoconvex curve. Trace grade I retrolisthesis of L3 on L4 appears degenerative.  Vertebrae: No aggressive osseous lesions. Vertebral body height is preserved.  Conus medullaris: Normal termination at L1.  Paraspinal tissues: Partially visualized mild bilateral SI joint degenerative disease. Mild calcification of the proximal LEFT renal artery incidentally noted.  Disc levels:  T12-L1:  Negative.  L1-L2:  Negative.  L2-L3: Mild RIGHT facet arthrosis. Shallow circumferential disc bulging. No stenosis.  L3-L4: Disk degeneration with circumferential disc bulging. Small RIGHT central disc protrusion(Image 58 series 2). Mild central stenosis is present associated with bulging disc. Lateral recesses appear adequately patent. Both neural foramina appear patent. Minimal LEFT facet degeneration/arthrosis.   L4-L5: Disk degeneration with circumferential disc bulging. There is a or RIGHT subarticular zone extrusion (mild cranial migration of disc material) that compresses and posteriorly displaces the descending RIGHT L5 nerve in the lateral recess. Mild central stenosis associated with disc bulging. Both neural foramina appear adequately patent. Minimal LEFT facet arthrosis.  L5-S1: Central canal patent. Moderate disc degeneration with degenerative endplate changes. Shallow bilateral foraminal endplate osteophytes produce mild bilateral foraminal stenosis.  IMPRESSION: LUMBAR MYELOGRAM IMPRESSION:  1. Technically successful lumbar puncture at all L3-L4 for lumbar myelogram using 22 gauge pencil point Whitacre spinal needle. 2. RIGHT lateral recess stenosis at L4-L5 potentially affecting the descending RIGHT L5 nerve. 3. Minimal grade I retrolisthesis of L3 on L4 that corrects anatomic with flexion.  CT LUMBAR MYELOGRAM IMPRESSION:  CT confirms L4-L5 RIGHT subarticular zone disc extrusion with mild caudal migration of disc material compressing the descending RIGHT L5 nerve in this patient with RIGHT lower extremity radiculopathy.   Electronically Signed   By: Dereck Ligas M.D.   On: 05/19/2014 15:46   Dg Spine Portable 1 View  06/02/2014   CLINICAL DATA:  Surgical procedure at L4-5  EXAM: PORTABLE SPINE - 1 VIEW  COMPARISON:  Earlier today  FINDINGS: A surgical instrument projects over the superior border of the L5 pedicle. Lap sponge is present in the operative bed.  IMPRESSION: Intraoperative marking at L5.   Electronically Signed   By: Maryclare Bean M.D.   On: 06/02/2014 10:34   Dg Spine Portable 1 View  06/02/2014   CLINICAL DATA:  Disc protrusion.  EXAM: PORTABLE SPINE - 1 VIEW  COMPARISON:  CT myelogram dated 05/19/2014  FINDINGS: Image 3 demonstrates instruments at the L4-5 level.  IMPRESSION: Instruments at L4-5.   Electronically Signed   By: Rozetta Nunnery M.D.   On: 06/02/2014 09:50   Dg Spine Portable 1  View  06/02/2014   CLINICAL DATA:  Surgical procedure at L4-5.  EXAM: PORTABLE SPINE - 1 VIEW  COMPARISON:  None.  FINDINGS: Metal surgical instruments project over the L4 and L4 spinous processes per  IMPRESSION: Intraoperative marking at L4  and L5.   Electronically Signed   By: Maryclare Bean M.D.   On: 06/02/2014 09:28   Dg Spine Portable 1 View  06/02/2014   CLINICAL DATA:  L4-5 surgery  EXAM: PORTABLE SPINE - 1 VIEW  COMPARISON:  None.  FINDINGS: Surgical instruments have been utilized to mark the L4 and L5 spinous processes.  IMPRESSION: Intraoperative marking of the L4 and L4 spinous processes.   Electronically Signed   By: Maryclare Bean M.D.   On: 06/02/2014 09:15   Dg Myelography Lumbar Inj Lumbosacral  05/19/2014   CLINICAL DATA:  RIGHT hip pain. 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 Lumbar spinal stenosis. RIGHT-sided sciatic up.  EXAM: LUMBAR MYELOGRAM  FLUOROSCOPY TIME:  0 min 28 seconds  PROCEDURE: After thorough discussion of risks and benefits of the procedure including bleeding, infection, injury to nerves, blood vessels, adjacent structures as well as headache and CSF leak, written and oral informed consent was obtained. Consent was obtained by Dr. Dereck Ligas. Time out form was completed.  Patient was positioned prone on the fluoroscopy table. Local anesthesia was provided with 1% lidocaine without epinephrine after prepped and draped in the usual sterile fashion. Puncture was performed at L3-L4 using a 3 1/2 inch 22 gauge pencil point Whitacre spinal needle via RIGHT paramedian approach. Using a single pass through the dura, the needle was placed within the thecal sac, with return of clear CSF. 15 mL of Omnipaque-180 was injected into the thecal sac, with normal opacification of the nerve roots and cauda equina consistent with free flow within the subarachnoid space.  I personally performed the lumbar puncture and administered the intrathecal contrast. I also personally supervised  acquisition of the myelogram images.  TECHNIQUE: Contiguous axial images were obtained through the Lumbar spine after the intrathecal infusion of infusion. Coronal and sagittal reconstructions were obtained of the axial image sets.  COMPARISON:  CT 04/03/2012.  FINDINGS: LUMBAR MYELOGRAM FINDINGS:  L5-S1 predominant disc space loss is present. Shallow extradural impressions are present at L2-L3, L3-L4 and L4-L5. On the prone myelogram images, there is mild mass effect on the RIGHT L4 nerve as it approaches the lateral recess, with truncation of the nerve root sleeve.  On standing upright images, there is a mild dextroconvex curve with the apex at L3. Straightening of the normal lumbar lordosis on upright lateral view. 2 mm retrolisthesis of L3 on L4 is probably degenerative. The retrolisthesis of L3 on L4 corrects anatomic with flexion. Retrolisthesis is restored with extension. Instability appears minor, without an exacerbation of the disc bulge. L1-L2 shallow disc protrusion is also evident on standing upright images.  CT LUMBAR MYELOGRAM FINDINGS:  Segmentation: 5 lumbar type vertebral bodies.  Alignment: Mild straightening of the normal lumbar lordosis. Mild dextroconvex curve with the apex at L2. Compensatory lower levoconvex curve. Trace grade I retrolisthesis of L3 on L4 appears degenerative.  Vertebrae: No aggressive osseous lesions. Vertebral body height is preserved.  Conus medullaris: Normal termination at L1.  Paraspinal tissues: Partially visualized mild bilateral SI joint degenerative disease. Mild calcification of the proximal LEFT renal artery incidentally noted.  Disc levels:  T12-L1:  Negative.  L1-L2:  Negative.  L2-L3: Mild RIGHT facet arthrosis. Shallow circumferential disc bulging. No stenosis.  L3-L4: Disk degeneration with circumferential disc bulging. Small RIGHT central disc protrusion(Image 58 series 2). Mild central stenosis is present associated with bulging disc. Lateral recesses appear  adequately patent. Both neural foramina appear patent. Minimal LEFT facet degeneration/arthrosis.  L4-L5: Disk degeneration with  circumferential disc bulging. There is a or RIGHT subarticular zone extrusion (mild cranial migration of disc material) that compresses and posteriorly displaces the descending RIGHT L5 nerve in the lateral recess. Mild central stenosis associated with disc bulging. Both neural foramina appear adequately patent. Minimal LEFT facet arthrosis.  L5-S1: Central canal patent. Moderate disc degeneration with degenerative endplate changes. Shallow bilateral foraminal endplate osteophytes produce mild bilateral foraminal stenosis.  IMPRESSION: LUMBAR MYELOGRAM IMPRESSION:  1. Technically successful lumbar puncture at all L3-L4 for lumbar myelogram using 22 gauge pencil point Whitacre spinal needle. 2. RIGHT lateral recess stenosis at L4-L5 potentially affecting the descending RIGHT L5 nerve. 3. Minimal grade I retrolisthesis of L3 on L4 that corrects anatomic with flexion.  CT LUMBAR MYELOGRAM IMPRESSION:  CT confirms L4-L5 RIGHT subarticular zone disc extrusion with mild caudal migration of disc material compressing the descending RIGHT L5 nerve in this patient with RIGHT lower extremity radiculopathy.   Electronically Signed   By: Dereck Ligas M.D.   On: 05/19/2014 15:46    Disposition: 01-Home or Self Care      Discharge Instructions   Call MD / Call 911    Complete by:  As directed   If you experience chest pain or shortness of breath, CALL 911 and be transported to the hospital emergency room.  If you develope a fever above 101 F, pus (white drainage) or increased drainage or redness at the wound, or calf pain, call your surgeon's office.     Constipation Prevention    Complete by:  As directed   Drink plenty of fluids.  Prune juice may be helpful.  You may use a stool softener, such as Colace (over the counter) 100 mg twice a day.  Use MiraLax (over the counter) for  constipation as needed.     Diet general    Complete by:  As directed      Discharge instructions    Complete by:  As directed   Change your dressing daily. Shower only, no tub bath. Call if any temperatures greater than 101 or any wound complications: [phone] during the day and ask for Dr. Charlestine Night nurse, Brunilda Payor.     Driving restrictions    Complete by:  As directed   No driving for 2 weeks     Increase activity slowly as tolerated    Complete by:  As directed      Lifting restrictions    Complete by:  As directed   No lifting           Follow-up Information   Follow up with GIOFFRE,RONALD A, MD. Schedule an appointment as soon as possible for a visit in 2 weeks.   Specialty:  Orthopedic Surgery   Contact information:   [address] [place] [address] [phone]        Signed: Ventura Bruns 06/03/2014, 7:43 AM

## 2014-06-03 NOTE — Progress Notes (Signed)
UR completed 

## 2014-06-03 NOTE — Progress Notes (Signed)
   Subjective: 1 Day Post-Op Procedure(s) (LRB): LUMBAR LAMINECTOMY,CENTRAL  DECOMPRESSION, MICRODISCECTOMY, FORAMINOTOMY  LUMBAR FOUR TO LUMBAR FIVE RIGHT (Right)  Pt doing well Mild pain to lumbar spine Ready for d/c home Patient reports pain as mild.  Objective:   VITALS:   Filed Vitals:   06/03/14 0634  BP: 156/74  Pulse: 84  Temp: 98.4 F (36.9 C)  Resp: 18    Lumbar incision healing well nv intact distally No rashes or edema distally  LABS No results found for this basename: HGB, HCT, WBC, PLT,  in the last 72 hours  No results found for this basename: NA, K, BUN, CREATININE, GLUCOSE,  in the last 72 hours   Assessment/Plan: 1 Day Post-Op Procedure(s) (LRB): LUMBAR LAMINECTOMY,CENTRAL  DECOMPRESSION, MICRODISCECTOMY, FORAMINOTOMY  LUMBAR FOUR TO LUMBAR FIVE RIGHT (Right)  D/c home today  F/u in 2 weeks with Dr. Annabelle Harman, MPAS, PA-C  06/03/2014, 7:42 AM

## 2014-06-05 ENCOUNTER — Encounter (HOSPITAL_COMMUNITY): Payer: Self-pay | Admitting: Orthopedic Surgery

## 2014-06-29 DIAGNOSIS — M545 Low back pain, unspecified: Secondary | ICD-10-CM | POA: Diagnosis not present

## 2014-07-13 DIAGNOSIS — M545 Low back pain: Secondary | ICD-10-CM | POA: Diagnosis not present

## 2015-06-13 DIAGNOSIS — R600 Localized edema: Secondary | ICD-10-CM | POA: Diagnosis not present

## 2015-06-13 DIAGNOSIS — I872 Venous insufficiency (chronic) (peripheral): Secondary | ICD-10-CM | POA: Diagnosis not present

## 2015-06-13 DIAGNOSIS — Z1389 Encounter for screening for other disorder: Secondary | ICD-10-CM | POA: Diagnosis not present

## 2015-06-13 DIAGNOSIS — Z125 Encounter for screening for malignant neoplasm of prostate: Secondary | ICD-10-CM | POA: Diagnosis not present

## 2015-06-13 DIAGNOSIS — G629 Polyneuropathy, unspecified: Secondary | ICD-10-CM | POA: Diagnosis not present

## 2015-06-13 DIAGNOSIS — E119 Type 2 diabetes mellitus without complications: Secondary | ICD-10-CM | POA: Diagnosis not present

## 2015-06-13 DIAGNOSIS — Z0000 Encounter for general adult medical examination without abnormal findings: Secondary | ICD-10-CM | POA: Diagnosis not present

## 2015-07-18 IMAGING — CR DG LUMBAR SPINE 2-3V
3 series · 3 of 3 positions shown · non-contrast
Comparison: CT lumbar myelography 05/19/2014

CLINICAL DATA: Preoperative evaluation for L4-L5 laminectomy

EXAM:
LUMBAR SPINE - 2-3 VIEW

[t l-spine a.p.]
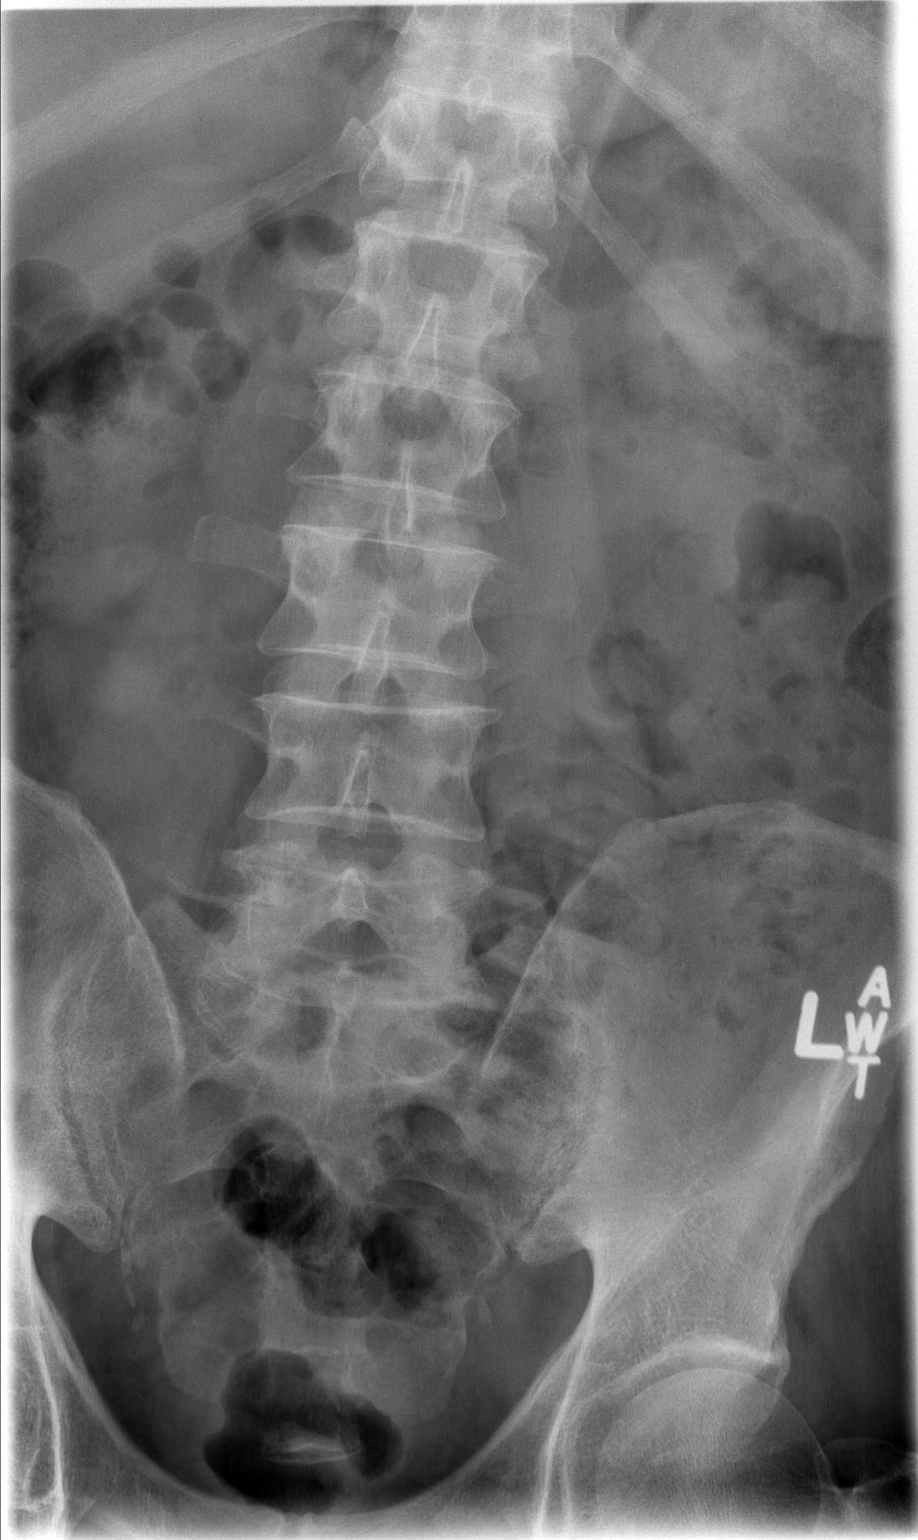

[t l-spine lat]
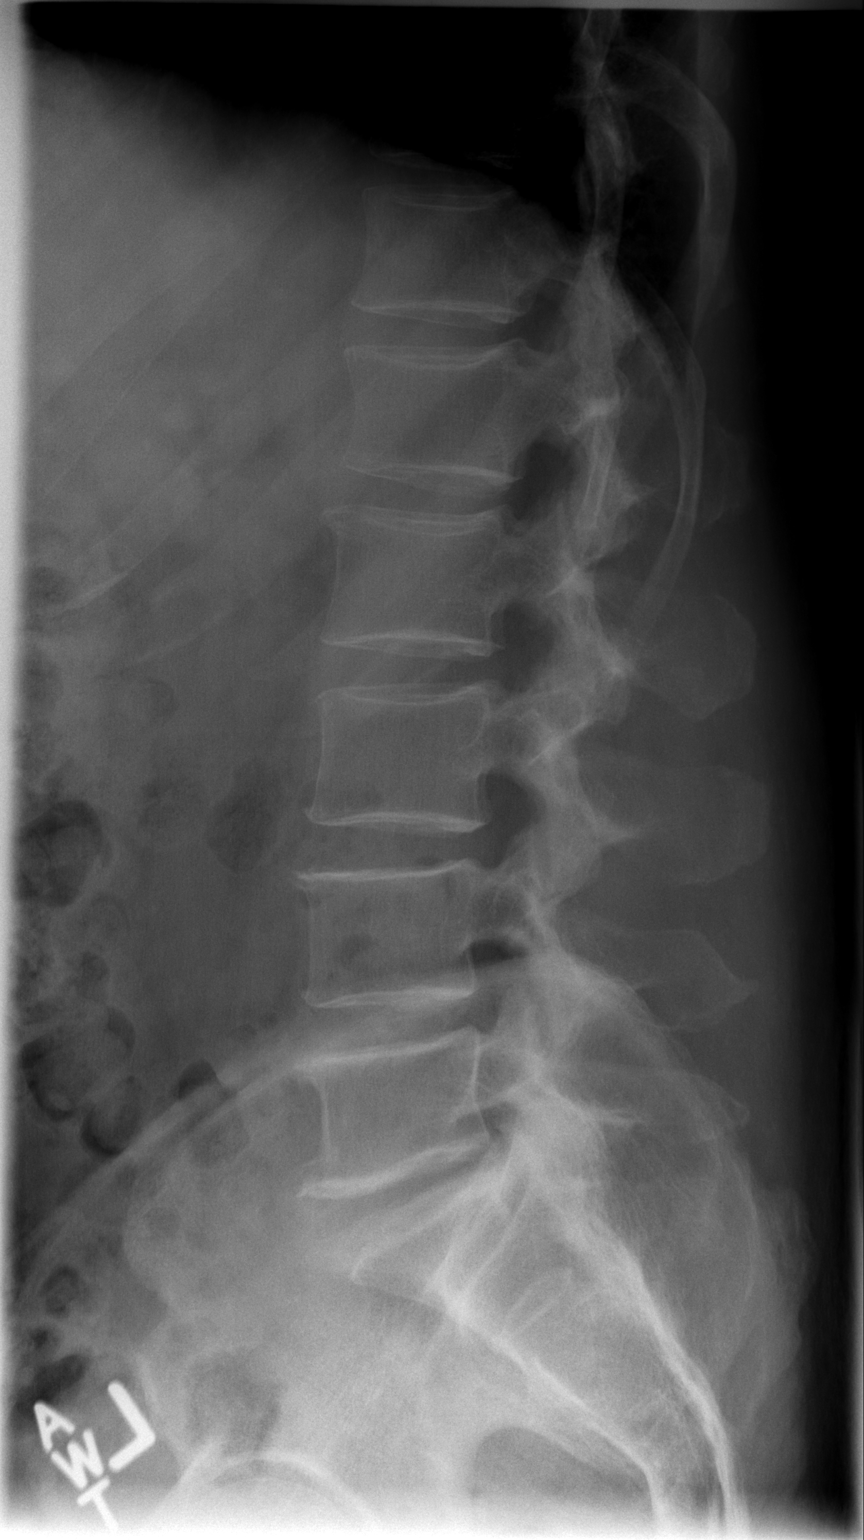

[t l-spine l5-s1 spot]
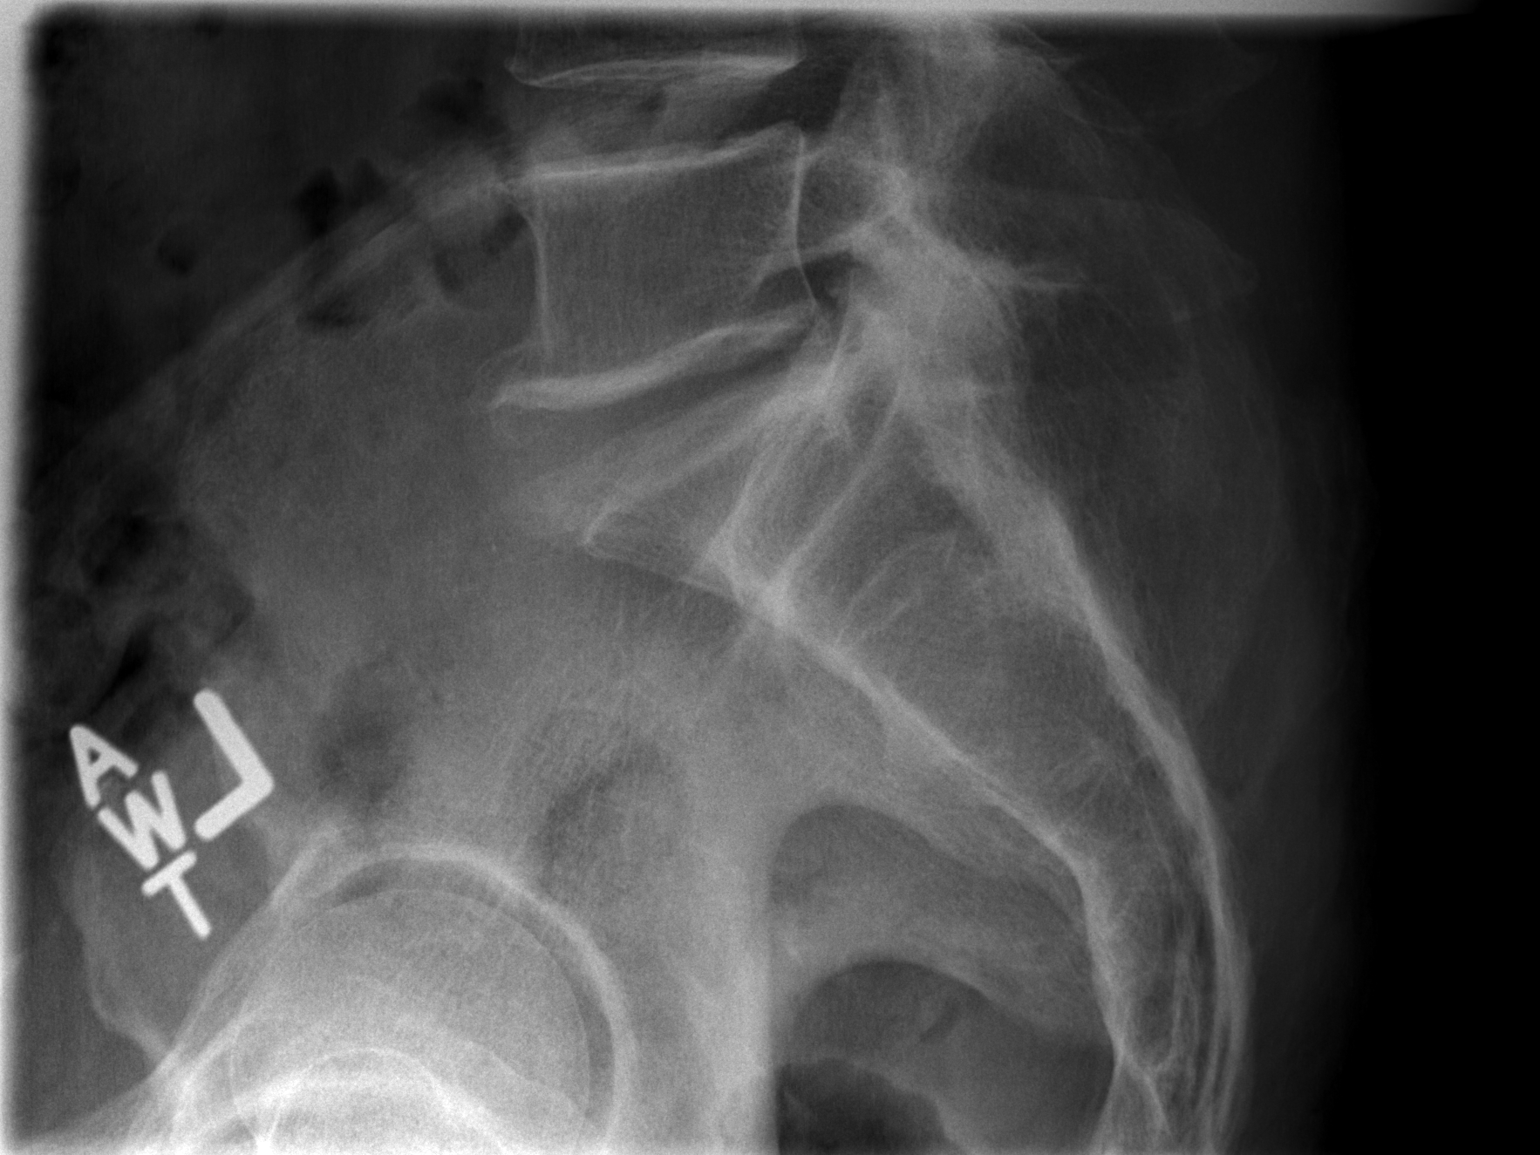

[3 of 3 positions shown; findings below may reference images not displayed]

FINDINGS: Five lumbar vertebrae.

Minimal endplate spur formation at multiple levels in lumbar spine.

Vertebral body and disc space heights maintained.

No acute fracture or subluxation.

Bones appear mildly demineralized.

No gross evidence of spondylolysis or bone destruction.

SI joints preserved.
IMPRESSION: No acute abnormalities.

Minimal degenerative disc disease changes.

## 2015-07-21 IMAGING — DX DG SPINE 1V PORT
1 series · 1 of 1 positions shown · non-contrast
Comparison: None.

CLINICAL DATA: L4-5 surgery

EXAM:
PORTABLE SPINE - 1 VIEW

[l-spine x-table]
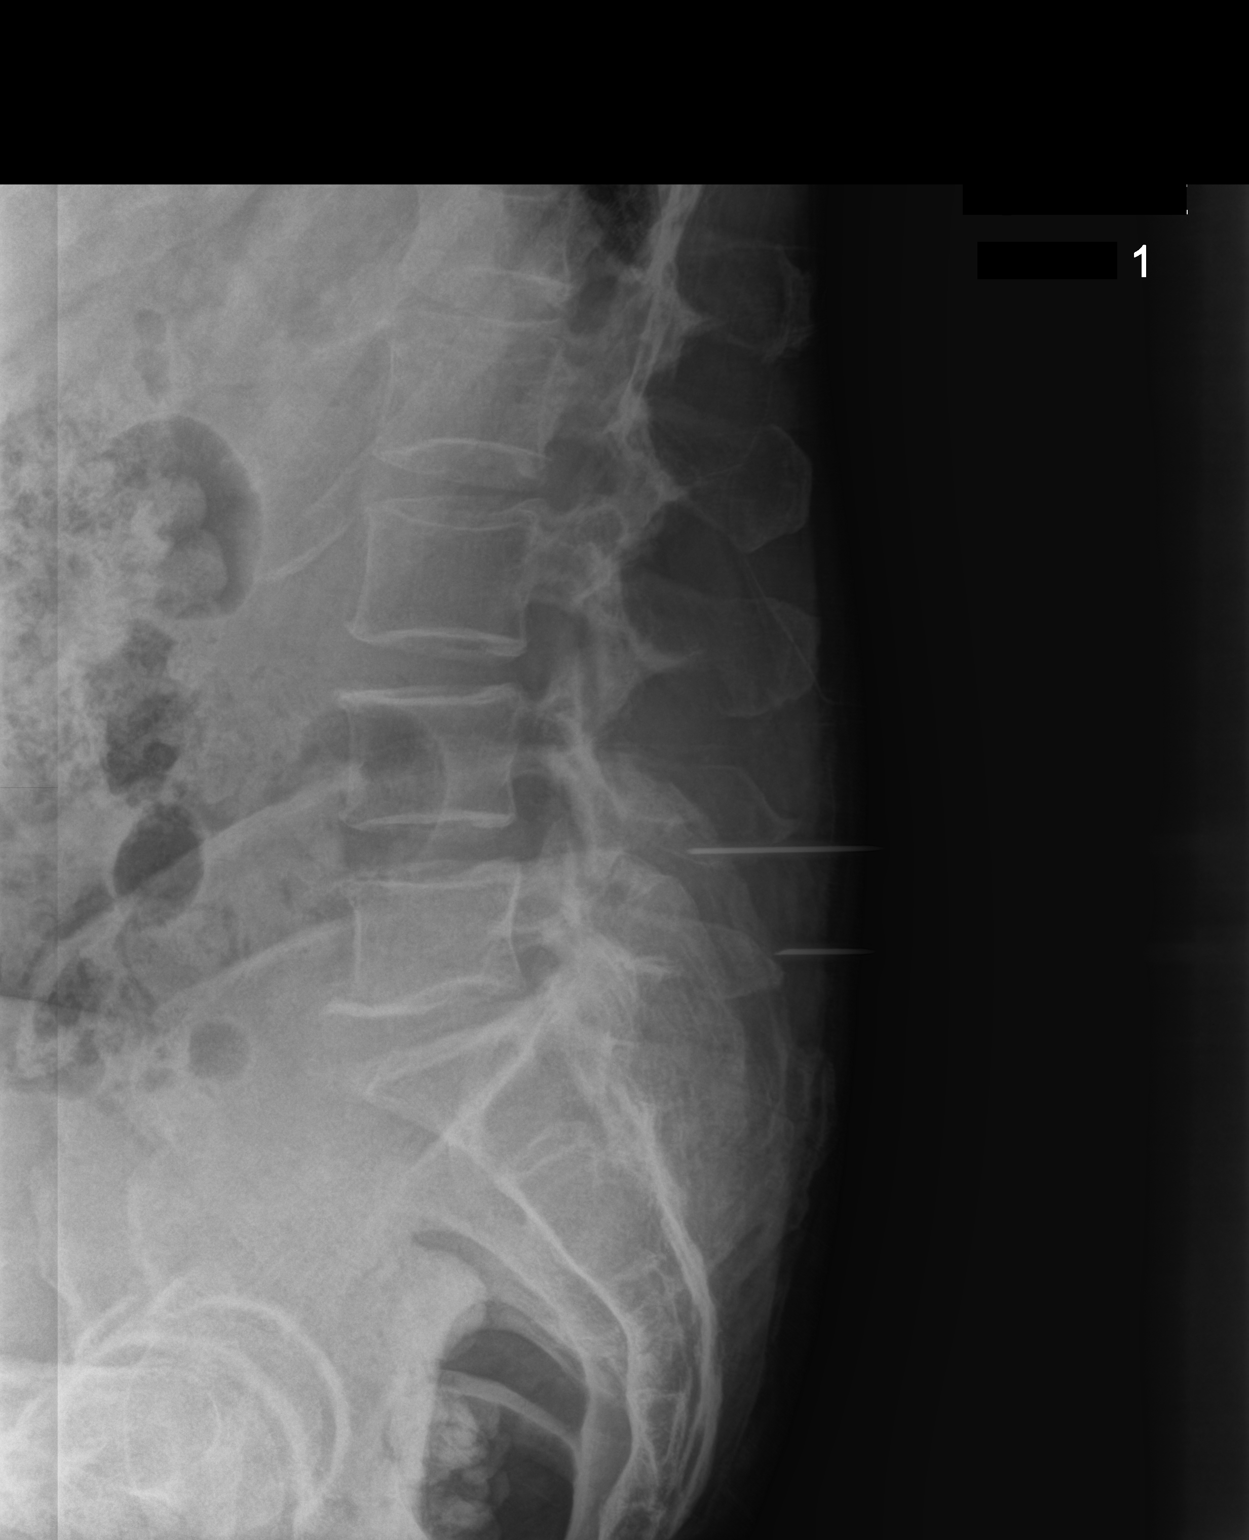

[1 of 1 positions shown; findings below may reference images not displayed]

FINDINGS: Surgical instruments have been utilized to mark the L4 and L5
spinous processes.
IMPRESSION: Intraoperative marking of the L4 and L4 spinous processes.

## 2015-07-21 IMAGING — DX DG SPINE 1V PORT
1 series · 1 of 1 positions shown · non-contrast
Comparison: Earlier today

CLINICAL DATA: Surgical procedure at L4-5

EXAM:
PORTABLE SPINE - 1 VIEW

[l-spine x-table]
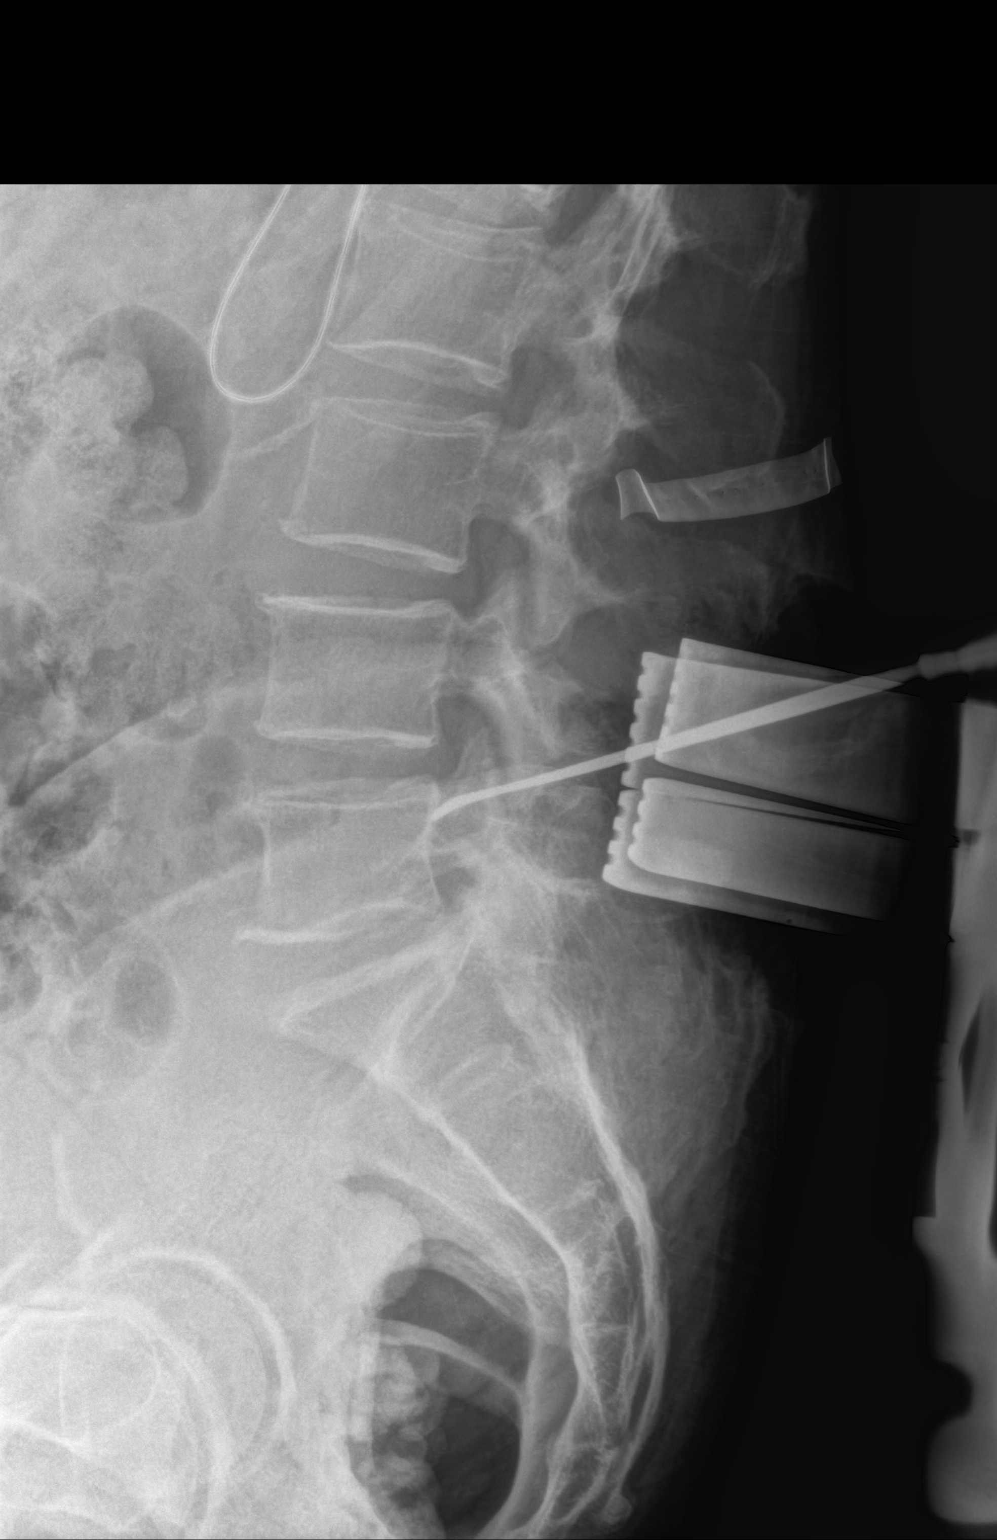

[1 of 1 positions shown; findings below may reference images not displayed]

FINDINGS: A surgical instrument projects over the superior border of the L5
pedicle. Lap sponge is present in the operative bed.
IMPRESSION: Intraoperative marking at L5.

## 2015-07-21 IMAGING — DX DG SPINE 1V PORT
1 series · 1 of 1 positions shown · non-contrast
Comparison: None.

CLINICAL DATA: Surgical procedure at L4-5.

EXAM:
PORTABLE SPINE - 1 VIEW

[l-spine x-table]
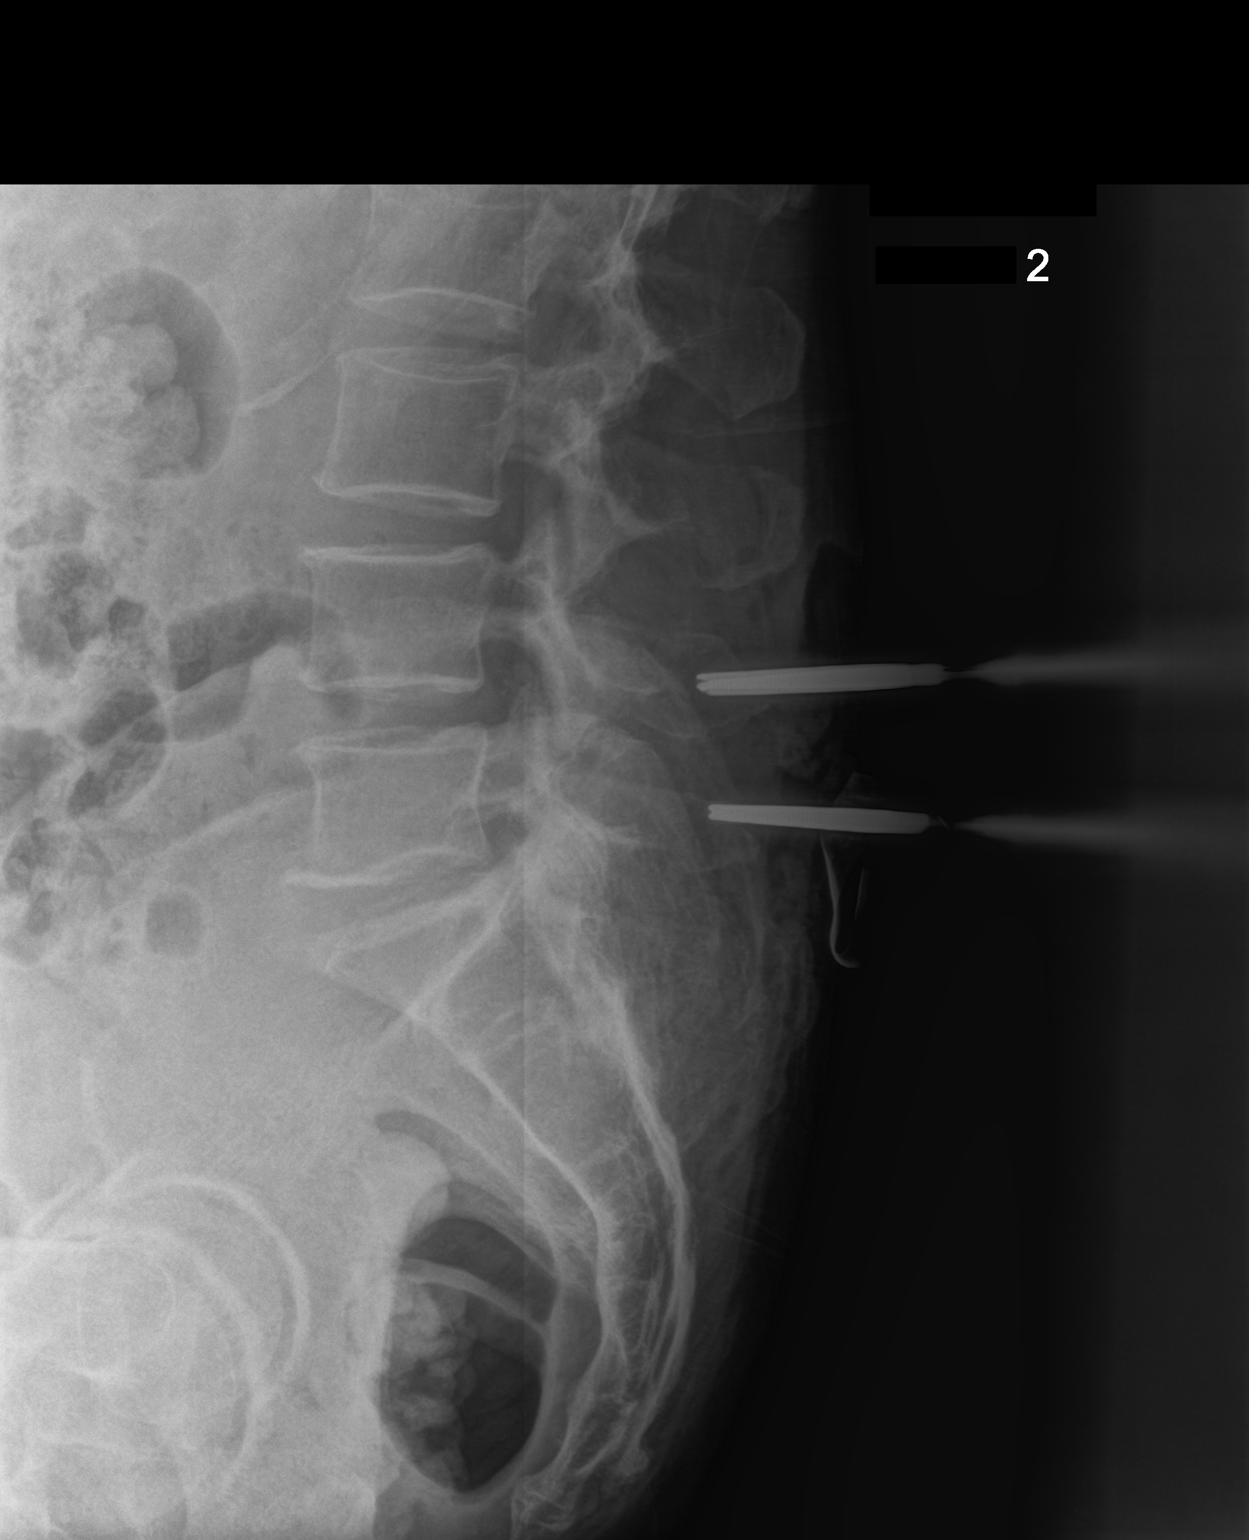

[1 of 1 positions shown; findings below may reference images not displayed]

FINDINGS: Metal surgical instruments project over the L4 and L4 spinous
processes per
IMPRESSION: Intraoperative marking at L4 and L5.

## 2015-08-16 DIAGNOSIS — E119 Type 2 diabetes mellitus without complications: Secondary | ICD-10-CM | POA: Diagnosis not present

## 2015-11-08 DIAGNOSIS — H608X2 Other otitis externa, left ear: Secondary | ICD-10-CM | POA: Diagnosis not present

## 2015-11-13 DIAGNOSIS — M25551 Pain in right hip: Secondary | ICD-10-CM | POA: Diagnosis not present

## 2015-11-13 DIAGNOSIS — M25511 Pain in right shoulder: Secondary | ICD-10-CM | POA: Diagnosis not present

## 2016-06-17 DIAGNOSIS — Z6832 Body mass index (BMI) 32.0-32.9, adult: Secondary | ICD-10-CM | POA: Diagnosis not present

## 2016-06-17 DIAGNOSIS — Z1159 Encounter for screening for other viral diseases: Secondary | ICD-10-CM | POA: Diagnosis not present

## 2016-06-17 DIAGNOSIS — Z1211 Encounter for screening for malignant neoplasm of colon: Secondary | ICD-10-CM | POA: Diagnosis not present

## 2016-06-17 DIAGNOSIS — G629 Polyneuropathy, unspecified: Secondary | ICD-10-CM | POA: Diagnosis not present

## 2016-06-17 DIAGNOSIS — I872 Venous insufficiency (chronic) (peripheral): Secondary | ICD-10-CM | POA: Diagnosis not present

## 2016-06-17 DIAGNOSIS — Z0000 Encounter for general adult medical examination without abnormal findings: Secondary | ICD-10-CM | POA: Diagnosis not present

## 2016-06-17 DIAGNOSIS — Z7984 Long term (current) use of oral hypoglycemic drugs: Secondary | ICD-10-CM | POA: Diagnosis not present

## 2016-06-17 DIAGNOSIS — E119 Type 2 diabetes mellitus without complications: Secondary | ICD-10-CM | POA: Diagnosis not present

## 2016-06-17 DIAGNOSIS — Z125 Encounter for screening for malignant neoplasm of prostate: Secondary | ICD-10-CM | POA: Diagnosis not present

## 2016-06-17 DIAGNOSIS — Z1389 Encounter for screening for other disorder: Secondary | ICD-10-CM | POA: Diagnosis not present

## 2016-09-01 ENCOUNTER — Ambulatory Visit (INDEPENDENT_AMBULATORY_CARE_PROVIDER_SITE_OTHER): Payer: Self-pay | Admitting: Urgent Care

## 2016-09-01 VITALS — BP 118/80 | HR 84 | Temp 98.0°F | Resp 18 | Ht 73.0 in | Wt 241.6 lb

## 2016-09-01 DIAGNOSIS — Z024 Encounter for examination for driving license: Secondary | ICD-10-CM

## 2016-09-01 DIAGNOSIS — Z9889 Other specified postprocedural states: Secondary | ICD-10-CM

## 2016-09-01 NOTE — Patient Instructions (Signed)

## 2016-09-01 NOTE — Progress Notes (Signed)
Commercial Driver Medical Examination   Cameron Young is a 70 y.o. male who presents today for a DOT physical exam. The patient reports a history of back surgery in 2014. Denies dizziness, chronic headache, blurred vision, chest pain, shortness of breath, heart racing, palpitations, nausea, vomiting, abdominal pain, back pain, hematuria, lower leg swelling.   The following portions of the patient's history were reviewed and updated as appropriate: allergies, current medications, past family history, past medical history, past social history and past surgical history.  Objective:   BP 118/80   Pulse 84   Temp 98 F (36.7 C) (Oral)   Resp 18   Ht 6\' 1"  (1.854 m)   Wt 241 lb 9.6 oz (109.6 kg)   SpO2 98%   BMI 31.88 kg/m   Vision/hearing:  Visual Acuity Screening   Right eye Left eye Both eyes  Without correction: 20/30 20/30 20/30   With correction:     Comments: Peripheral Vision: Right eye 85 degrees. Left eye 85 degrees.  Color 6/6  Hearing Screening Comments: Whisper 56ft right ear               38ft left ear  Patient can recognize and distinguish among traffic control signals and devices showing standard red, green, and amber colors.  Corrective lenses required: No  Monocular Vision?: No  Hearing aid requirement: No  Physical Exam  Constitutional: He is oriented to person, place, and time. He appears well-developed and well-nourished.  HENT:  TM's intact bilaterally, no effusions or erythema. Nasal turbinates pink and moist, nasal passages patent. No sinus tenderness. Oropharynx clear, mucous membranes moist, dentition in good repair.  Eyes: Conjunctivae and EOM are normal. Pupils are equal, round, and reactive to light. Right eye exhibits no discharge. Left eye exhibits no discharge. No scleral icterus.  Neck: Normal range of motion. Neck supple. No thyromegaly present.  Cardiovascular: Normal rate, regular rhythm and intact distal pulses.  Exam reveals no gallop and  no friction rub.   No murmur heard. Pulmonary/Chest: No stridor. No respiratory distress. He has no wheezes. He has no rales.  Abdominal: Soft. Bowel sounds are normal. He exhibits no distension and no mass. There is no tenderness.  Musculoskeletal: Normal range of motion. He exhibits no edema or tenderness.       Back:  Lymphadenopathy:    He has no cervical adenopathy.  Neurological: He is alert and oriented to person, place, and time. He has normal reflexes.  Skin: Skin is warm and dry. No rash noted. No erythema. No pallor.  Psychiatric: He has a normal mood and affect.   Labs: Comments: sp gr: 1.010 Pro: Neg Glu: Neg Blo: Neg  Assessment:    Healthy male exam.  Meets standards in 1 CFR 391.41;  qualifies for 2 year certificate.    Plan:   Medical examiners certificate completed and printed. Return as needed.

## 2017-11-20 DIAGNOSIS — M25561 Pain in right knee: Secondary | ICD-10-CM | POA: Diagnosis not present

## 2018-06-07 ENCOUNTER — Ambulatory Visit (INDEPENDENT_AMBULATORY_CARE_PROVIDER_SITE_OTHER): Payer: Medicare Other | Admitting: Physician Assistant

## 2018-06-07 ENCOUNTER — Encounter: Payer: Self-pay | Admitting: Physician Assistant

## 2018-06-07 ENCOUNTER — Other Ambulatory Visit: Payer: Self-pay

## 2018-06-07 VITALS — BP 138/92 | HR 72 | Temp 97.8°F | Resp 16 | Ht 71.0 in | Wt 227.0 lb

## 2018-06-07 DIAGNOSIS — E119 Type 2 diabetes mellitus without complications: Secondary | ICD-10-CM | POA: Diagnosis not present

## 2018-06-07 DIAGNOSIS — G629 Polyneuropathy, unspecified: Secondary | ICD-10-CM | POA: Diagnosis not present

## 2018-06-07 DIAGNOSIS — Z0000 Encounter for general adult medical examination without abnormal findings: Secondary | ICD-10-CM

## 2018-06-07 MED ORDER — GABAPENTIN 300 MG PO CAPS
ORAL_CAPSULE | ORAL | 3 refills | Status: DC
Start: 2018-06-07 — End: 2021-01-31

## 2018-06-07 NOTE — Progress Notes (Signed)
Patient ID: Cameron Young MRN: [id number], DOB: [birth year], 72 y.o. Date of Encounter: @DATE @  Chief Complaint:  Chief Complaint  Patient presents with  . New Patient (Initial Visit)    HPI: 72 y.o. year old male  presents as a New Patient to Establish Care.   He reports that he was seeing a PCP, Dr. Deforest Young.   States that his last visit with him was "sometime since Christmas"--- does not remember the exact date. He does not know when his last CPE/preventive care was done.   Therefore, I will make sure to wait for year for insurance/cost purposes.  Will wait to do that Spring 2020 pleat year since he last saw Dr. Deforest Young and since we are not sure which of his visits may have been considered CPE/preventive.  Cameron Young states that he has been going to the doctor routinely for years for checkups. Reports that he was just diagnosed with the diabetes about 1 year ago. States that he had already been going for eye appointments every couple years -- even before the diabetes was diagnosed.   States that he does have an eye appointment scheduled for next month-- September 2019.  He reports that he has problems with peripheral neuropathy in his feet.   States that has been going on for years--- well before the diabetes.  ( ? 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 Reports that he "has no feeling in his feet ".  States that the only time he feels tingling is if he is walking around barefoot or when he is putting on his socks or something.  Otherwise, the rest of the time, he is not feeling any actual discomfort -- but just feels numb. Today I discussed that there are medications that can help with this.   He reports he has never been on any medication for this.   He reports that he does want to try the medication.  He has had lumbar spine  surgery in the past. He has had a kidney stone. He has no other known past medical history.  Just the diabetes, peripheral neuropathy, history of lumbar spine surgery.   He does work as a [employer].  No longer has tobacco.  Now has grain and hogs. Continues to be physically active.      Past Medical History:  Diagnosis Date  . Arthritis   . Borderline diabetic   . DVT (deep venous thrombosis) ([place]) 2012   right leg  . H/O bronchitis 25 years ago   x1  . History of kidney stones    x2     Home Meds: Outpatient Medications Prior to Visit  Medication Sig Dispense Refill  . metFORMIN (GLUCOPHAGE) 500 MG tablet Take 500 mg by mouth 2 (two) times daily with a meal.  3   No facility-administered medications prior to visit.     Allergies: No Known Allergies  Social History   Socioeconomic History  . Marital status: Married    Spouse name: Not on file  . Number of children: Not on file  . Years of education: Not on file  . Highest education level: Not on file  Occupational History  . Not on file  Social Needs  . Financial resource strain: Not on file  . Food insecurity:    Worry: Not on  file    Inability: Not on file  . Transportation needs:    Medical: Not on file    Non-medical: Not on file  Tobacco Use  . Smoking status: Never Smoker  . Smokeless tobacco: Never Used  Substance and Sexual Activity  . Alcohol use: No  . Drug use: No  . Sexual activity: Never    Birth control/protection: None  Lifestyle  . Physical activity:    Days per week: Not on file    Minutes per session: Not on file  . Stress: Not on file  Relationships  . Social connections:    Talks on phone: Not on file    Gets together: Not on file    Attends religious service: Not on file    Active member of club or organization: Not on file    Attends meetings of clubs or organizations: Not on file    Relationship status: Not on file  . Intimate partner violence:    Fear of current or ex  partner: Not on file    Emotionally abused: Not on file    Physically abused: Not on file    Forced sexual activity: Not on file  Other Topics Concern  . Not on file  Social History Narrative  . Not on file    History reviewed. No pertinent family history.   Review of Systems:  See HPI for pertinent ROS. All other ROS negative.    Physical Exam: Blood pressure (!) 138/92, pulse 72, temperature 97.8 F (36.6 C), temperature source Oral, resp. rate 16, height 5\' 11"  (1.803 m), weight 103 kg, SpO2 99 %., Body mass index is 31.66 kg/m. General: WNWD WM. Appears in no acute distress. Neck: Supple. No thyromegaly. No lymphadenopathy. No carotid bruits. Lungs: Clear bilaterally to auscultation without wheezes, rales, or rhonchi. Breathing is unlabored. Heart: RRR with S1 S2. No murmurs, rubs, or gallops. Abdomen: Soft, non-tender, non-distended with normoactive bowel sounds. No hepatomegaly. No rebound/guarding. No obvious abdominal masses. Musculoskeletal:  Strength and tone normal for age. Extremities/Skin: Warm and dry.  No LE edema. Neuro: Alert and oriented X 3. Moves all extremities spontaneously. Gait is normal. CNII-XII grossly in tact. Psych:  Responds to questions appropriately with a normal affect.     ASSESSMENT AND PLAN:  72 y.o. year old male with   1. Encounter for medical examination to establish care  2. Type 2 diabetes mellitus without complication, without long-term current use of insulin ([place]) He is on metformin. He does have routine eye exams.  He has eye exam scheduled for next month September 2019. Check A1c to monitor.  Also check lipid panel and will consider statin, given his diabetes. Discussed follow-up in 3 months versus 6 months.  He is agreeable to go ahead and follow-up in 3 months.  Follow-up sooner if needed. - COMPLETE METABOLIC PANEL WITH GFR - Hemoglobin A1c - Lipid panel  3. Peripheral polyneuropathy He reports that he had neuropathy  symptoms well before his diabetes was diagnosed.  Reports that he had seen podiatry and specialists.  Is not certain the etiology of this neuropathy.  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  He also reports he has never been on medication for this.  He is agreeable to try medication.  He is to take the gabapentin 1 at bedtime for 4 days then go to 1 twice daily.  Discussed that the dose of this can be further increased.  He is to call me if the medication is  causing any adverse effects.  As well, he is to call me if he is not feeling any symptom relief once he gets on the twice daily dosing and then I can give instructions on further titration up of dose.   Also, will follow-up this at his follow-up visit 3 months. - gabapentin (NEURONTIN) 300 MG capsule; Take one at bedtime for 4 days. Then take one twice a day.  Dispense: 60 capsule; Refill: 3   Discussed that BP is borderline high.  He reports that it is usually a lot lower.  Ideally, he would benefit from ACE inhibitor or ARB given his diabetes. However, since this is his first visit here and he is reporting that blood pressure is usually lower,  will hold off on further medication adjustments today. Will make sure to closely monitor blood pressure at future visit.  Also will discuss adding ASA 81mg  QD at next OV.  In Spring 2020 will do CPE / Preventive Care.   Routine follow-up visit 3 months.  Follow-up sooner if needed.   Marin Olp [place], [place], [facility] 06/07/2018 8:58 AM

## 2018-06-08 LAB — LIPID PANEL
CHOLESTEROL: 148 mg/dL (ref <200)
HDL: 44 mg/dL (ref >40)
LDL Cholesterol (Calc): 76 mg/dL (calc)
NON-HDL CHOLESTEROL (CALC): 104 mg/dL (ref <130)
Total CHOL/HDL Ratio: 3.4 (calc) (ref <5.0)
Triglycerides: 182 mg/dL — ABNORMAL HIGH (ref <150)

## 2018-06-08 LAB — COMPLETE METABOLIC PANEL WITH GFR
AG Ratio: 1.7 (calc) (ref 1.0–2.5)
ALKALINE PHOSPHATASE (APISO): 50 U/L (ref 40–115)
ALT: 14 U/L (ref 9–46)
AST: 14 U/L (ref 10–35)
Albumin: 4.3 g/dL (ref 3.6–5.1)
BUN: 12 mg/dL (ref 7–25)
CO2: 26 mmol/L (ref 20–32)
CREATININE: 1.09 mg/dL (ref 0.70–1.18)
Calcium: 9.4 mg/dL (ref 8.6–10.3)
Chloride: 106 mmol/L (ref 98–110)
GFR, EST NON AFRICAN AMERICAN: 67 mL/min/{1.73_m2} (ref >=60)
GFR, Est African American: 78 mL/min/{1.73_m2} (ref >=60)
GLOBULIN: 2.6 g/dL (ref 1.9–3.7)
Glucose, Bld: 142 mg/dL — ABNORMAL HIGH (ref 65–99)
Potassium: 4.7 mmol/L (ref 3.5–5.3)
SODIUM: 138 mmol/L (ref 135–146)
Total Bilirubin: 0.4 mg/dL (ref 0.2–1.2)
Total Protein: 6.9 g/dL (ref 6.1–8.1)

## 2018-06-08 LAB — HEMOGLOBIN A1C
Hgb A1c MFr Bld: 6.5 % of total Hgb — ABNORMAL HIGH (ref <5.7)
MEAN PLASMA GLUCOSE: 140 (calc)
eAG (mmol/L): 7.7 (calc)

## 2018-06-30 DIAGNOSIS — H5201 Hypermetropia, right eye: Secondary | ICD-10-CM | POA: Diagnosis not present

## 2018-06-30 DIAGNOSIS — H2513 Age-related nuclear cataract, bilateral: Secondary | ICD-10-CM | POA: Diagnosis not present

## 2018-06-30 DIAGNOSIS — E119 Type 2 diabetes mellitus without complications: Secondary | ICD-10-CM | POA: Diagnosis not present

## 2018-06-30 DIAGNOSIS — H5212 Myopia, left eye: Secondary | ICD-10-CM | POA: Diagnosis not present

## 2018-09-08 ENCOUNTER — Ambulatory Visit: Payer: Medicare Other | Admitting: Physician Assistant

## 2018-12-02 ENCOUNTER — Telehealth: Payer: Self-pay | Admitting: *Deleted

## 2018-12-02 ENCOUNTER — Encounter: Payer: Self-pay | Admitting: Family Medicine

## 2018-12-02 ENCOUNTER — Ambulatory Visit (INDEPENDENT_AMBULATORY_CARE_PROVIDER_SITE_OTHER): Payer: Medicare Other | Admitting: Family Medicine

## 2018-12-02 VITALS — BP 130/80 | HR 80 | Temp 98.1°F | Resp 16 | Ht 73.0 in | Wt 241.0 lb

## 2018-12-02 DIAGNOSIS — Z125 Encounter for screening for malignant neoplasm of prostate: Secondary | ICD-10-CM | POA: Diagnosis not present

## 2018-12-02 DIAGNOSIS — Z23 Encounter for immunization: Secondary | ICD-10-CM | POA: Diagnosis not present

## 2018-12-02 DIAGNOSIS — E119 Type 2 diabetes mellitus without complications: Secondary | ICD-10-CM

## 2018-12-02 NOTE — Telephone Encounter (Signed)
Received verbal orders for Cologuard.   Order placed via [facility].   Cologuard (Order [phone])

## 2018-12-02 NOTE — Addendum Note (Signed)
Addended by: Shary Decamp B on: 12/02/2018 11:49 AM   Modules accepted: Orders

## 2018-12-02 NOTE — Progress Notes (Signed)
Subjective:    Patient ID: Cameron Young, male    DOB: [birth year], 73 y.o.   MRN: [id number]  HPI  Patient has a history of DM2.  Currently on metformin 500 mg pobid.  Here today for a recheck.  Also due for prostate cancer screening.  Due for pneumovax and flu shot.  Overdue for colonoscopy.   Patient is not currently taking an aspirin.  He believes he is taking another medication in addition to the metformin for his sugars although he is not certain of the name.  Denies any polyuria, polydipsia, or blurry vision.  He is willing to proceed with a PSA for prostate cancer.  He is also willing to proceed with Cologuard for colon cancer screening.  He declines a colonoscopy.  He declines a flu shot but he would be willing to receive Pneumovax 23.  Blood pressure today is well controlled at 130/80.  He denies any chest pain shortness of breath dyspnea on exertion.  He does have some numbness and tingling in both feet.  He occasionally gets pins-and-needles.  Right foot is worse than the left foot.  He denies any polyuria polydipsia or blurry vision.  He is not checking his sugars.  Past Medical History:  Diagnosis Date  . Arthritis   . Borderline diabetic   . DVT (deep venous thrombosis) ([place]) 2012   right leg  . H/O bronchitis 25 years ago   x1  . History of kidney stones    x2   Past Surgical History:  Procedure Laterality Date  . HEMI-MICRODISCECTOMY LUMBAR LAMINECTOMY LEVEL 1 Right 06/02/2014   Procedure: LUMBAR LAMINECTOMY,CENTRAL  DECOMPRESSION, MICRODISCECTOMY, FORAMINOTOMY  LUMBAR FOUR TO LUMBAR FIVE RIGHT;  Surgeon: Tobi Bastos, MD;  Location: [facility];  Service: Orthopedics;  Laterality: Right;  . NO PAST SURGERIES     Current Outpatient Medications on File Prior to Visit  Medication Sig Dispense Refill  . gabapentin (NEURONTIN) 300 MG capsule Take one at bedtime for 4 days. Then take one twice a day. 60 capsule 3  . metFORMIN (GLUCOPHAGE) 500 MG tablet Take 500 mg by mouth 2  (two) times daily with a meal.  3   No current facility-administered medications on file prior to visit.    No Known Allergies Social History   Socioeconomic History  . Marital status: Married    Spouse name: Not on file  . Number of children: Not on file  . Years of education: Not on file  . Highest education level: Not on file  Occupational History  . Not on file  Social Needs  . Financial resource strain: Not on file  . Food insecurity:    Worry: Not on file    Inability: Not on file  . Transportation needs:    Medical: Not on file    Non-medical: Not on file  Tobacco Use  . Smoking status: Never Smoker  . Smokeless tobacco: Never Used  Substance and Sexual Activity  . Alcohol use: No  . Drug use: No  . Sexual activity: Never    Birth control/protection: None  Lifestyle  . Physical activity:    Days per week: Not on file    Minutes per session: Not on file  . Stress: Not on file  Relationships  . Social connections:    Talks on phone: Not on file    Gets together: Not on file    Attends religious service: Not on file    Active member of club  or organization: Not on file    Attends meetings of clubs or organizations: Not on file    Relationship status: Not on file  . Intimate partner violence:    Fear of current or ex partner: Not on file    Emotionally abused: Not on file    Physically abused: Not on file    Forced sexual activity: Not on file  Other Topics Concern  . Not on file  Social History Narrative  . Not on file     Review of Systems  All other systems reviewed and are negative.      Objective:   Physical Exam Vitals signs reviewed.  Constitutional:      General: He is not in acute distress.    Appearance: Normal appearance. He is not ill-appearing.  Cardiovascular:     Rate and Rhythm: Normal rate and regular rhythm.     Pulses: Normal pulses.     Heart sounds: Normal heart sounds. No murmur. No friction rub. No gallop.   Pulmonary:       Effort: Pulmonary effort is normal. No respiratory distress.     Breath sounds: Normal breath sounds. No stridor. No wheezing, rhonchi or rales.  Chest:     Chest wall: No tenderness.  Abdominal:     General: Bowel sounds are normal. There is no distension.     Palpations: Abdomen is soft.     Tenderness: There is no abdominal tenderness. There is no guarding or rebound.  Musculoskeletal:     Right lower leg: No edema.     Left lower leg: No edema.  Neurological:     Mental Status: He is alert.           Assessment & Plan:  Type 2 diabetes mellitus without complication, without long-term current use of insulin ([facility]) - Plan: Hemoglobin A1c, CBC with Differential/Platelet, COMPLETE METABOLIC PANEL WITH GFR, Lipid panel, Microalbumin, urine  Prostate cancer screening - Plan: PSA  Patient declines his flu shot.  He did receive Pneumovax 23.  Blood pressures well controlled at 130/80.  I encouraged the patient to take a baby aspirin 81 mg a day.  I will schedule the patient for Cologuard as he declines colonoscopy.  I will check a PSA.  In addition get hemoglobin A1c, CBC, CMP, and fasting lipid panel.  Obtain records to determine what the other medication he is on.  Goal hemoglobin A1c is less than 6.5.  Goal LDL cholesterol is less than 100.  If microalbumin is elevated, I would recommend starting an ACE inhibitor however patient is hesitant to take any medication.

## 2018-12-02 NOTE — Telephone Encounter (Signed)
-----   Message from Susy Frizzle, MD sent at 12/02/2018  8:45 AM EST ----- Patient needs cologuard scheduled.

## 2018-12-03 LAB — CBC WITH DIFFERENTIAL/PLATELET
Absolute Monocytes: 800 cells/uL (ref 200–950)
Basophils Absolute: 43 cells/uL (ref 0–200)
Basophils Relative: 0.5 %
EOS ABS: 181 {cells}/uL (ref 15–500)
Eosinophils Relative: 2.1 %
HCT: 46 % (ref 38.5–50.0)
HEMOGLOBIN: 15.8 g/dL (ref 13.2–17.1)
Lymphs Abs: 2210 cells/uL (ref 850–3900)
MCH: 30.3 pg (ref 27.0–33.0)
MCHC: 34.3 g/dL (ref 32.0–36.0)
MCV: 88.1 fL (ref 80.0–100.0)
MPV: 11.1 fL (ref 7.5–12.5)
Monocytes Relative: 9.3 %
NEUTROS ABS: 5366 {cells}/uL (ref 1500–7800)
Neutrophils Relative %: 62.4 %
Platelets: 211 10*3/uL (ref 140–400)
RBC: 5.22 10*6/uL (ref 4.20–5.80)
RDW: 12.2 % (ref 11.0–15.0)
Total Lymphocyte: 25.7 %
WBC: 8.6 10*3/uL (ref 3.8–10.8)

## 2018-12-03 LAB — COMPLETE METABOLIC PANEL WITH GFR
AG Ratio: 1.6 (calc) (ref 1.0–2.5)
ALT: 17 U/L (ref 9–46)
AST: 14 U/L (ref 10–35)
Albumin: 4.2 g/dL (ref 3.6–5.1)
Alkaline phosphatase (APISO): 53 U/L (ref 35–144)
BILIRUBIN TOTAL: 0.4 mg/dL (ref 0.2–1.2)
BUN: 15 mg/dL (ref 7–25)
CO2: 25 mmol/L (ref 20–32)
Calcium: 9.6 mg/dL (ref 8.6–10.3)
Chloride: 104 mmol/L (ref 98–110)
Creat: 1.13 mg/dL (ref 0.70–1.18)
GFR, EST NON AFRICAN AMERICAN: 64 mL/min/{1.73_m2} (ref >=60)
GFR, Est African American: 74 mL/min/{1.73_m2} (ref >=60)
Globulin: 2.7 g/dL (calc) (ref 1.9–3.7)
Glucose, Bld: 183 mg/dL — ABNORMAL HIGH (ref 65–99)
Potassium: 4.8 mmol/L (ref 3.5–5.3)
SODIUM: 139 mmol/L (ref 135–146)
Total Protein: 6.9 g/dL (ref 6.1–8.1)

## 2018-12-03 LAB — LIPID PANEL
CHOLESTEROL: 154 mg/dL (ref <200)
HDL: 49 mg/dL (ref >=40)
LDL Cholesterol (Calc): 78 mg/dL (calc)
NON-HDL CHOLESTEROL (CALC): 105 mg/dL (ref <130)
TRIGLYCERIDES: 167 mg/dL — AB (ref <150)
Total CHOL/HDL Ratio: 3.1 (calc) (ref <5.0)

## 2018-12-03 LAB — MICROALBUMIN, URINE: Microalb, Ur: 0.6 mg/dL

## 2018-12-03 LAB — PSA: PSA: 1.8 ng/mL (ref <=4.0)

## 2018-12-03 LAB — HEMOGLOBIN A1C
EAG (MMOL/L): 10.4 (calc)
Hgb A1c MFr Bld: 8.2 % of total Hgb — ABNORMAL HIGH (ref <5.7)
Mean Plasma Glucose: 189 (calc)

## 2018-12-10 ENCOUNTER — Other Ambulatory Visit: Payer: Self-pay | Admitting: Family Medicine

## 2018-12-10 MED ORDER — DAPAGLIFLOZIN PROPANEDIOL 10 MG PO TABS: 10.0000 mg | ORAL_TABLET | Freq: Every day | ORAL | 3 refills | Status: DC

## 2019-01-31 ENCOUNTER — Emergency Department (HOSPITAL_COMMUNITY)
Admission: EM | Admit: 2019-01-31 | Discharge: 2019-01-31 | Disposition: A | Discharge status: Home / Self Care | Payer: Medicare Other | Attending: Emergency Medicine | Admitting: Emergency Medicine

## 2019-01-31 ENCOUNTER — Other Ambulatory Visit: Payer: Self-pay

## 2019-01-31 ENCOUNTER — Emergency Department (HOSPITAL_COMMUNITY): Payer: Medicare Other

## 2019-01-31 ENCOUNTER — Encounter (HOSPITAL_COMMUNITY): Payer: Self-pay | Admitting: *Deleted

## 2019-01-31 DIAGNOSIS — N201 Calculus of ureter: Secondary | ICD-10-CM

## 2019-01-31 DIAGNOSIS — R109 Unspecified abdominal pain: Secondary | ICD-10-CM | POA: Diagnosis present

## 2019-01-31 DIAGNOSIS — Z7984 Long term (current) use of oral hypoglycemic drugs: Secondary | ICD-10-CM | POA: Insufficient documentation

## 2019-01-31 DIAGNOSIS — N130 Hydronephrosis with ureteropelvic junction obstruction: Secondary | ICD-10-CM | POA: Diagnosis not present

## 2019-01-31 DIAGNOSIS — E119 Type 2 diabetes mellitus without complications: Secondary | ICD-10-CM | POA: Diagnosis not present

## 2019-01-31 DIAGNOSIS — N132 Hydronephrosis with renal and ureteral calculous obstruction: Secondary | ICD-10-CM | POA: Diagnosis not present

## 2019-01-31 LAB — CBC
HCT: 45.2 % (ref 39.0–52.0)
Hemoglobin: 15.4 g/dL (ref 13.0–17.0)
MCH: 30.5 pg (ref 26.0–34.0)
MCHC: 34.1 g/dL (ref 30.0–36.0)
MCV: 89.5 fL (ref 80.0–100.0)
Platelets: 215 10*3/uL (ref 150–400)
RBC: 5.05 MIL/uL (ref 4.22–5.81)
RDW: 12.3 % (ref 11.5–15.5)
WBC: 9 10*3/uL (ref 4.0–10.5)
nRBC: 0 % (ref 0.0–0.2)

## 2019-01-31 LAB — COMPREHENSIVE METABOLIC PANEL
ALT: 23 U/L (ref 0–44)
AST: 21 U/L (ref 15–41)
Albumin: 4 g/dL (ref 3.5–5.0)
Alkaline Phosphatase: 53 U/L (ref 38–126)
Anion gap: 13 (ref 5–15)
BUN: 14 mg/dL (ref 8–23)
CO2: 22 mmol/L (ref 22–32)
Calcium: 9.5 mg/dL (ref 8.9–10.3)
Chloride: 104 mmol/L (ref 98–111)
Creatinine, Ser: 1.2 mg/dL (ref 0.61–1.24)
GFR calc Af Amer: >60 mL/min (ref >60)
GFR calc non Af Amer: 60 mL/min — ABNORMAL LOW (ref >60)
Glucose, Bld: 202 mg/dL — ABNORMAL HIGH (ref 70–99)
Potassium: 4.1 mmol/L (ref 3.5–5.1)
Sodium: 139 mmol/L (ref 135–145)
Total Bilirubin: 0.6 mg/dL (ref 0.3–1.2)
Total Protein: 7.1 g/dL (ref 6.5–8.1)

## 2019-01-31 MED ORDER — KETOROLAC TROMETHAMINE 15 MG/ML IJ SOLN
15.0000 mg | Freq: Once | INTRAMUSCULAR | Status: AC
  Administered 2019-01-31: 09:00:00 15 mg via INTRAVENOUS
  Filled 2019-01-31: qty 1

## 2019-01-31 MED ORDER — ONDANSETRON HCL 4 MG/2ML IJ SOLN
4.0000 mg | Freq: Once | INTRAMUSCULAR | Status: AC
  Administered 2019-01-31: 4 mg via INTRAVENOUS
  Filled 2019-01-31: qty 2

## 2019-01-31 MED ORDER — IBUPROFEN 600 MG PO TABS: 600.0000 mg | ORAL_TABLET | Freq: Three times a day (TID) | ORAL | 0 refills | Status: DC | PRN

## 2019-01-31 MED ORDER — HYDROMORPHONE HCL 1 MG/ML IJ SOLN
1.0000 mg | Freq: Once | INTRAMUSCULAR | Status: AC
  Administered 2019-01-31: 09:00:00 1 mg via INTRAVENOUS
  Filled 2019-01-31: qty 1

## 2019-01-31 MED ORDER — TAMSULOSIN HCL 0.4 MG PO CAPS: 0.4000 mg | ORAL_CAPSULE | Freq: Every day | ORAL | 0 refills | Status: DC

## 2019-01-31 MED ORDER — ONDANSETRON 8 MG PO TBDP: 8.0000 mg | ORAL_TABLET | Freq: Three times a day (TID) | ORAL | 0 refills | Status: DC | PRN

## 2019-01-31 MED ORDER — OXYCODONE-ACETAMINOPHEN 5-325 MG PO TABS
2.0000 | ORAL_TABLET | Freq: Once | ORAL | Status: AC
  Administered 2019-01-31: 11:00:00 2 via ORAL
  Filled 2019-01-31: qty 2

## 2019-01-31 MED ORDER — OXYCODONE-ACETAMINOPHEN 5-325 MG PO TABS: 1.0000 | ORAL_TABLET | ORAL | 0 refills | Status: DC | PRN

## 2019-01-31 MED ORDER — KETOROLAC TROMETHAMINE 30 MG/ML IJ SOLN
15.0000 mg | Freq: Once | INTRAMUSCULAR | Status: AC
  Administered 2019-01-31: 11:00:00 15 mg via INTRAVENOUS
  Filled 2019-01-31: qty 1

## 2019-01-31 NOTE — ED Notes (Addendum)
Urinal bedside  

## 2019-01-31 NOTE — ED Notes (Addendum)
Pt returned from CT °

## 2019-01-31 NOTE — ED Provider Notes (Signed)
[facility] EMERGENCY DEPARTMENT Provider Note   CSN: [id number] Arrival date & time: 01/31/19  0825    History   Chief Complaint Chief Complaint  Patient presents with   Abdominal Pain    HPI Cameron Young is a 73 y.o. male.     HPI Patient is a 73 year old male presents the emergency department severe right-sided abdominal pain which began today.  Reports nausea without vomiting.  Denies diarrhea.  No fevers or chills.  No urinary complaints.  No cough or congestion.  No COVID-19 exposure.  Does have a history of kidney stones.  No significant pain with radiation to his right flank pain some radiation into his right groin.    Past Medical History:  Diagnosis Date   Arthritis    Borderline diabetic    DVT (deep venous thrombosis) ([place]) 2012   right leg   H/O bronchitis 25 years ago   x1   History of kidney stones    x2    Patient Active Problem List   Diagnosis Date Noted   Spinal stenosis, lumbar region, with neurogenic claudication 06/02/2014   Herniated lumbar intervertebral disc 06/02/2014    Past Surgical History:  Procedure Laterality Date   HEMI-MICRODISCECTOMY LUMBAR LAMINECTOMY LEVEL 1 Right 06/02/2014   Procedure: LUMBAR LAMINECTOMY,CENTRAL  DECOMPRESSION, MICRODISCECTOMY, FORAMINOTOMY  LUMBAR FOUR TO LUMBAR FIVE RIGHT;  Surgeon: Tobi Bastos, MD;  Location: [facility];  Service: Orthopedics;  Laterality: Right;   NO PAST SURGERIES          Home Medications    Prior to Admission medications   Medication Sig Start Date End Date Taking? Authorizing Provider  dapagliflozin propanediol (FARXIGA) 10 MG TABS tablet Take 10 mg by mouth daily. 12/10/18   Susy Frizzle, MD  gabapentin (NEURONTIN) 300 MG capsule Take one at bedtime for 4 days. Then take one twice a day. 06/07/18   Orlena Sheldon, PA-C  ibuprofen (ADVIL) 600 MG tablet Take 1 tablet (600 mg total) by mouth every 8 (eight) hours as needed. 01/31/19   Jola Schmidt,  MD  metFORMIN (GLUCOPHAGE) 500 MG tablet Take 500 mg by mouth 2 (two) times daily with a meal. 05/07/18   [provider]  ondansetron (ZOFRAN ODT) 8 MG disintegrating tablet Take 1 tablet (8 mg total) by mouth every 8 (eight) hours as needed for nausea or vomiting. 01/31/19   Jola Schmidt, MD  oxyCODONE-acetaminophen (PERCOCET/ROXICET) 5-325 MG tablet Take 1 tablet by mouth every 4 (four) hours as needed for severe pain. 01/31/19   Jola Schmidt, MD  tamsulosin (FLOMAX) 0.4 MG CAPS capsule Take 1 capsule (0.4 mg total) by mouth daily. 01/31/19   Jola Schmidt, MD    Family History No family history on file.  Social History Social History   Tobacco Use   Smoking status: Never Smoker   Smokeless tobacco: Never Used  Substance Use Topics   Alcohol use: No   Drug use: No     Allergies   Patient has no known allergies.   Review of Systems Review of Systems  All other systems reviewed and are negative.    Physical Exam Updated Vital Signs BP 131/76    Pulse 61    Temp 98 F (36.7 C) (Oral)    Resp 16    Ht 6\' 1"  (1.854 m)    Wt 106.6 kg    SpO2 93%    BMI 31.00 kg/m   Physical Exam Vitals signs and nursing note reviewed.  Constitutional:      Appearance: He is well-developed.  HENT:     Head: Normocephalic and atraumatic.  Neck:     Musculoskeletal: Normal range of motion.  Cardiovascular:     Rate and Rhythm: Normal rate and regular rhythm.     Heart sounds: Normal heart sounds.  Pulmonary:     Effort: Pulmonary effort is normal. No respiratory distress.     Breath sounds: Normal breath sounds.  Abdominal:     General: There is no distension.     Palpations: Abdomen is soft.     Tenderness: There is no abdominal tenderness.  Musculoskeletal: Normal range of motion.  Skin:    General: Skin is warm and dry.  Neurological:     Mental Status: He is alert and oriented to person, place, and time.  Psychiatric:        Judgment: Judgment normal.       ED Treatments / Results  Labs (all labs ordered are listed, but only abnormal results are displayed) Labs Reviewed  COMPREHENSIVE METABOLIC PANEL - Abnormal; Notable for the following components:      Result Value   Glucose, Bld 202 (*)    GFR calc non Af Amer 60 (*)    All other components within normal limits  CBC  URINALYSIS, ROUTINE W REFLEX MICROSCOPIC    EKG None  Radiology Ct Renal Stone Study  Result Date: 01/31/2019 CLINICAL DATA:  73 year old male with right flank pain and a history of renal stones. EXAM: CT ABDOMEN AND PELVIS WITHOUT CONTRAST TECHNIQUE: Multidetector CT imaging of the abdomen and pelvis was performed following the standard protocol without IV contrast. COMPARISON:  Prior CT scan of the abdomen and pelvis 04/03/2012 FINDINGS: Lower chest: No acute abnormality.  Small hiatal hernia. Hepatobiliary: No focal liver abnormality is seen. No gallstones, gallbladder wall thickening, or biliary dilatation. Pancreas: Unremarkable. No pancreatic ductal dilatation or surrounding inflammatory changes. Spleen: Normal in size without focal abnormality. Adrenals/Urinary Tract: Normal adrenal glands. Punctate stones in the right renal collecting system including within the upper and lower poles. There is mild hydronephrosis, renal edema and perinephric stranding. Similarly, there is mild ureterectasis due to a 3 mm stone in the distal right ureter. The left kidney is normal in appearance. The bladder is normal in appearance. Stomach/Bowel: Colonic diverticular disease without CT evidence of active inflammation. No focal bowel wall thickening or evidence of obstruction. Vascular/Lymphatic: Limited evaluation in the absence of intravenous contrast. No evidence of aneurysm. Reproductive: Prostate is unremarkable. Other: No abdominal wall hernia or abnormality. No abdominopelvic ascites. Musculoskeletal: No acute or significant osseous findings. IMPRESSION: 1. Small and at least  partially obstructing 3 mm right distal ureteral stone resulting in mild right hydronephrosis and perinephric stranding. 2. Additional punctate right-sided nephrolithiasis. 3. Small hiatal hernia. 4. Colonic diverticular disease without CT evidence of active inflammation. Electronically Signed   By: Jacqulynn Cadet M.D.   On: 01/31/2019 09:30    Procedures Procedures (including critical care time)  Medications Ordered in ED Medications  HYDROmorphone (DILAUDID) injection 1 mg (1 mg Intravenous Given 01/31/19 0858)  ondansetron (ZOFRAN) injection 4 mg (4 mg Intravenous Given 01/31/19 0858)  ketorolac (TORADOL) 15 MG/ML injection 15 mg (15 mg Intravenous Given 01/31/19 0859)  ketorolac (TORADOL) 30 MG/ML injection 15 mg (15 mg Intravenous Given 01/31/19 1106)  oxyCODONE-acetaminophen (PERCOCET/ROXICET) 5-325 MG per tablet 2 tablet (2 tablets Oral Given 01/31/19 1106)     Initial Impression / Assessment and Plan / ED Course  I have reviewed the triage vital signs and the nursing notes.  Pertinent labs & imaging results that were available during my care of the patient were reviewed by me and considered in my medical decision making (see chart for details).        Patient with right ureteral stone.  Pain controlled here in the emergency department.  Stone visible on CT with associated hydronephrosis.  Feels much better.  Urology follow-up.  Standard stone precautions.  Standard medications.  Final Clinical Impressions(s) / ED Diagnoses   Final diagnoses:  Right ureteral stone    ED Discharge Orders         Ordered    oxyCODONE-acetaminophen (PERCOCET/ROXICET) 5-325 MG tablet  Every 4 hours PRN     01/31/19 1147    ondansetron (ZOFRAN ODT) 8 MG disintegrating tablet  Every 8 hours PRN     01/31/19 1147    ibuprofen (ADVIL) 600 MG tablet  Every 8 hours PRN     01/31/19 1147    tamsulosin (FLOMAX) 0.4 MG CAPS capsule  Daily     01/31/19 1147           Jola Schmidt, MD 01/31/19  1149

## 2019-01-31 NOTE — ED Notes (Signed)
Patient verbalizes understanding of discharge instructions. Opportunity for questioning and answers were provided. Armband removed by staff, pt discharged from ED.  

## 2019-01-31 NOTE — ED Notes (Signed)
Patient transported to CT 

## 2019-01-31 NOTE — ED Triage Notes (Signed)
Pt states RLQ pain, acute onset, since last night.  Hx of kidney stones and this feels the same.

## 2019-02-04 ENCOUNTER — Other Ambulatory Visit: Payer: Self-pay | Admitting: Family Medicine

## 2019-02-04 ENCOUNTER — Telehealth: Payer: Self-pay | Admitting: Family Medicine

## 2019-02-04 MED ORDER — OXYCODONE-ACETAMINOPHEN 10-325 MG PO TABS: 1.0000 | ORAL_TABLET | ORAL | 0 refills | Status: AC | PRN

## 2019-02-04 NOTE — Telephone Encounter (Signed)
We can give him stronger dose.  I will give him oxycodone 10 mg that he can take every 4 hours

## 2019-02-04 NOTE — Telephone Encounter (Signed)
Pt's wife aware.

## 2019-02-04 NOTE — Telephone Encounter (Signed)
Pt's wife called and stated that he was seen in ER on 01/31/19 for kidney stone and he has not passed it yet and would like to know if he could have something stronger for pain as the medication the ER gave him is not helping much or is there something else he can do?

## 2019-02-16 ENCOUNTER — Other Ambulatory Visit: Payer: Self-pay | Admitting: Family Medicine

## 2019-02-16 MED ORDER — METFORMIN HCL 500 MG PO TABS: 500.0000 mg | ORAL_TABLET | Freq: Two times a day (BID) | ORAL | 3 refills | Status: DC

## 2019-06-23 ENCOUNTER — Encounter: Payer: Self-pay | Admitting: Family Medicine

## 2019-06-23 ENCOUNTER — Other Ambulatory Visit: Payer: Self-pay

## 2019-06-23 ENCOUNTER — Ambulatory Visit (INDEPENDENT_AMBULATORY_CARE_PROVIDER_SITE_OTHER): Payer: Medicare Other | Admitting: Family Medicine

## 2019-06-23 VITALS — BP 134/82 | HR 74 | Temp 98.0°F | Resp 14 | Ht 73.0 in | Wt 231.0 lb

## 2019-06-23 DIAGNOSIS — E119 Type 2 diabetes mellitus without complications: Secondary | ICD-10-CM | POA: Diagnosis not present

## 2019-06-23 MED ORDER — CYCLOBENZAPRINE HCL 10 MG PO TABS: 10.0000 mg | ORAL_TABLET | Freq: Every day | ORAL | 0 refills | Status: DC

## 2019-06-23 NOTE — Progress Notes (Signed)
Subjective:    Patient ID: Cameron Young, male    DOB: April 18, 1946, 73 y.o.   MRN: [id number]  HPI  Patient is here today for a follow-up of his diabetes.  After his last visit, we added Farxiga to his metformin due to an elevated hemoglobin A1c.  He has not been checking his blood sugar.  He denies any symptoms of hypoglycemia.  In fact the only problem he reports is a dull headache over his right eye.  He states for the last month, usually in the middle of the day he will develop a pressure-like headache right above his right eye and in his right forehead.  It will usually last a few hours and then go away spontaneously.  He denies any sinus pressure.  He denies any rhinorrhea or head congestion.  He denies any sneezing.  He denies any otalgia.  He denies any blurry vision or double vision.  He denies any neurologic deficit.  He sleeps independently.  No one has ever told him that he grinds his teeth at night.  No one ever told him that he stops breathing or snores.  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  It does seem to be associated with heat.  Over the last week, he has not had a headache and it has been much cooler.  The patient does work as a Psychologist, sport and exercise.  He denies any chest pain shortness of breath dyspnea on exertion.  He does have some numbness and tingling in both feet.  He occasionally gets pins-and-needles.  Right foot is worse than the left foot.  He denies any polyuria polydipsia or blurry vision.  He is not checking his sugars.  Past Medical History:  Diagnosis Date  . Arthritis   . Borderline diabetic   . DVT (deep venous thrombosis) ([place]) 2012   right leg  . H/O bronchitis 25 years ago   x1  . History of kidney stones    x2   Past Surgical History:  Procedure Laterality Date  . HEMI-MICRODISCECTOMY LUMBAR LAMINECTOMY LEVEL 1 Right 06/02/2014   Procedure: LUMBAR LAMINECTOMY,CENTRAL  DECOMPRESSION, MICRODISCECTOMY, FORAMINOTOMY  LUMBAR  FOUR TO LUMBAR FIVE RIGHT;  Surgeon: Tobi Bastos, MD;  Location: [facility];  Service: Orthopedics;  Laterality: Right;  . NO PAST SURGERIES     Current Outpatient Medications on File Prior to Visit  Medication Sig Dispense Refill  . dapagliflozin propanediol (FARXIGA) 10 MG TABS tablet Take 10 mg by mouth daily. 30 tablet 3  . gabapentin (NEURONTIN) 300 MG capsule Take one at bedtime for 4 days. Then take one twice a day. 60 capsule 3  . ibuprofen (ADVIL) 600 MG tablet Take 1 tablet (600 mg total) by mouth every 8 (eight) hours as needed. 15 tablet 0  . metFORMIN (GLUCOPHAGE) 500 MG tablet Take 1 tablet (500 mg total) by mouth 2 (two) times daily with a meal. 180 tablet 3  . ondansetron (ZOFRAN ODT) 8 MG disintegrating tablet Take 1 tablet (8 mg total) by mouth every 8 (eight) hours as needed for nausea or vomiting. 10 tablet 0  . tamsulosin (FLOMAX) 0.4 MG CAPS capsule Take 1 capsule (0.4 mg total) by mouth daily. 10 capsule 0   No current facility-administered medications on file prior to visit.    No Known Allergies Social History   Socioeconomic History  . Marital status: Married    Spouse name: Not on file  . Number  of children: Not on file  . Years of education: Not on file  . Highest education level: Not on file  Occupational History  . Not on file  Social Needs  . Financial resource strain: Not on file  . Food insecurity    Worry: Not on file    Inability: Not on file  . Transportation needs    Medical: Not on file    Non-medical: Not on file  Tobacco Use  . Smoking status: Never Smoker  . Smokeless tobacco: Never Used  Substance and Sexual Activity  . Alcohol use: No  . Drug use: No  . Sexual activity: Never    Birth control/protection: None  Lifestyle  . Physical activity    Days per week: Not on file    Minutes per session: Not on file  . Stress: Not on file  Relationships  . Social Herbalist on phone: Not on file    Gets together: Not on file     Attends religious service: Not on file    Active member of club or organization: Not on file    Attends meetings of clubs or organizations: Not on file    Relationship status: Not on file  . Intimate partner violence    Fear of current or ex partner: Not on file    Emotionally abused: Not on file    Physically abused: Not on file    Forced sexual activity: Not on file  Other Topics Concern  . Not on file  Social History Narrative  . Not on file     Review of Systems  All other systems reviewed and are negative.      Objective:   Physical Exam Vitals signs reviewed.  Constitutional:      General: He is not in acute distress.    Appearance: Normal appearance. He is not ill-appearing.  Cardiovascular:     Rate and Rhythm: Normal rate and regular rhythm.     Pulses: Normal pulses.     Heart sounds: Normal heart sounds. No murmur. No friction rub. No gallop.   Pulmonary:     Effort: Pulmonary effort is normal. No respiratory distress.     Breath sounds: Normal breath sounds. No stridor. No wheezing, rhonchi or rales.  Chest:     Chest wall: No tenderness.  Abdominal:     General: Bowel sounds are normal. There is no distension.     Palpations: Abdomen is soft.     Tenderness: There is no abdominal tenderness. There is no guarding or rebound.  Musculoskeletal:     Right lower leg: No edema.     Left lower leg: No edema.  Neurological:     Mental Status: He is alert.           Assessment & Plan:  Type 2 diabetes mellitus without complication, without long-term current use of insulin (HCC) - Plan: Hemoglobin A1c, CBC with Differential/Platelet, COMPLETE METABOLIC PANEL WITH GFR, Lipid panel, Microalbumin, urine  Patient declines flu shot today.  I will check a hemoglobin A1c.  Goal hemoglobin A1c is less than 6.5.  I will also check a CBC, CMP, fasting lipid panel, and urine microalbumin.  Diabetic foot exam is consistent with diabetic neuropathy with diminished  sensation to 10 g monofilament.  Blood pressure today is adequately controlled.  I will check a fasting lipid panel.  Goal LDL cholesterol is less than 100.  I believe his headaches likely tension headache due to  grinding his teeth or possibly even dehydration.  I recommended trying Flexeril at night empirically to see if this is a muscle tension headache.  I also recommended that he try to drink more water and avoid heat.  If the headaches improve no further work-up is necessary.  If he continues to have a focal headache every day, I would recommend a CT scan of the brain.

## 2019-06-24 ENCOUNTER — Encounter: Payer: Self-pay | Admitting: Family Medicine

## 2019-06-24 LAB — HEMOGLOBIN A1C
Hgb A1c MFr Bld: 6.8 % of total Hgb — ABNORMAL HIGH (ref <5.7)
Mean Plasma Glucose: 148 (calc)
eAG (mmol/L): 8.2 (calc)

## 2019-06-24 LAB — CBC WITH DIFFERENTIAL/PLATELET
Absolute Monocytes: 626 cells/uL (ref 200–950)
Basophils Absolute: 50 cells/uL (ref 0–200)
Basophils Relative: 0.7 %
Eosinophils Absolute: 346 cells/uL (ref 15–500)
Eosinophils Relative: 4.8 %
HCT: 43.8 % (ref 38.5–50.0)
Hemoglobin: 15 g/dL (ref 13.2–17.1)
Lymphs Abs: 2174 cells/uL (ref 850–3900)
MCH: 30.1 pg (ref 27.0–33.0)
MCHC: 34.2 g/dL (ref 32.0–36.0)
MCV: 87.8 fL (ref 80.0–100.0)
MPV: 11.1 fL (ref 7.5–12.5)
Monocytes Relative: 8.7 %
Neutro Abs: 4003 cells/uL (ref 1500–7800)
Neutrophils Relative %: 55.6 %
Platelets: 223 10*3/uL (ref 140–400)
RBC: 4.99 10*6/uL (ref 4.20–5.80)
RDW: 12.7 % (ref 11.0–15.0)
Total Lymphocyte: 30.2 %
WBC: 7.2 10*3/uL (ref 3.8–10.8)

## 2019-06-24 LAB — COMPLETE METABOLIC PANEL WITH GFR
AG Ratio: 1.5 (calc) (ref 1.0–2.5)
ALT: 18 U/L (ref 9–46)
AST: 16 U/L (ref 10–35)
Albumin: 4.1 g/dL (ref 3.6–5.1)
Alkaline phosphatase (APISO): 53 U/L (ref 35–144)
BUN/Creatinine Ratio: 16 (calc) (ref 6–22)
BUN: 19 mg/dL (ref 7–25)
CO2: 23 mmol/L (ref 20–32)
Calcium: 9.4 mg/dL (ref 8.6–10.3)
Chloride: 107 mmol/L (ref 98–110)
Creat: 1.2 mg/dL — ABNORMAL HIGH (ref 0.70–1.18)
GFR, Est African American: 69 mL/min/{1.73_m2} (ref >=60)
GFR, Est Non African American: 60 mL/min/{1.73_m2} (ref >=60)
Globulin: 2.8 g/dL (calc) (ref 1.9–3.7)
Glucose, Bld: 149 mg/dL — ABNORMAL HIGH (ref 65–99)
Potassium: 4.6 mmol/L (ref 3.5–5.3)
Sodium: 139 mmol/L (ref 135–146)
Total Bilirubin: 0.6 mg/dL (ref 0.2–1.2)
Total Protein: 6.9 g/dL (ref 6.1–8.1)

## 2019-06-24 LAB — LIPID PANEL
Cholesterol: 164 mg/dL (ref <200)
HDL: 38 mg/dL — ABNORMAL LOW (ref >=40)
LDL Cholesterol (Calc): 95 mg/dL (calc)
Non-HDL Cholesterol (Calc): 126 mg/dL (calc) (ref <130)
Total CHOL/HDL Ratio: 4.3 (calc) (ref <5.0)
Triglycerides: 216 mg/dL — ABNORMAL HIGH (ref <150)

## 2019-06-24 LAB — MICROALBUMIN, URINE: Microalb, Ur: 0.9 mg/dL

## 2019-07-29 ENCOUNTER — Other Ambulatory Visit: Payer: Self-pay | Admitting: Family Medicine

## 2019-07-29 NOTE — Telephone Encounter (Signed)
Requested Prescriptions   Pending Prescriptions Disp Refills  . cyclobenzaprine (FLEXERIL) 10 MG tablet [Pharmacy Med Name: CYCLOBENZAPRINE 10 MG TABLET] 30 tablet 0    Sig: TAKE 1 TABLET BY MOUTH EVERYDAY AT BEDTIME    Last OV 06/23/2019  Last written 07/03/2019

## 2020-03-17 ENCOUNTER — Other Ambulatory Visit: Payer: Self-pay | Admitting: Family Medicine

## 2020-11-20 ENCOUNTER — Ambulatory Visit (INDEPENDENT_AMBULATORY_CARE_PROVIDER_SITE_OTHER): Payer: Medicare Other | Admitting: Family Medicine

## 2020-11-20 ENCOUNTER — Other Ambulatory Visit: Payer: Self-pay

## 2020-11-20 VITALS — BP 132/80 | HR 78 | Temp 97.8°F | Resp 16 | Ht 73.0 in | Wt 229.0 lb

## 2020-11-20 DIAGNOSIS — Z125 Encounter for screening for malignant neoplasm of prostate: Secondary | ICD-10-CM

## 2020-11-20 DIAGNOSIS — E119 Type 2 diabetes mellitus without complications: Secondary | ICD-10-CM | POA: Diagnosis not present

## 2020-11-20 NOTE — Progress Notes (Addendum)
Subjective:    Patient ID: Cameron Young, male    DOB: 08-27-1946, 75 y.o.   MRN: [id number]  Medication Refill    Patient is here today for a follow-up of his diabetes.  After his last visit, we added Farxiga to his metformin due to an elevated hemoglobin A1c.  He has not been checking his blood sugar.  He denies any symptoms of hypoglycemia.  In fact the only problem he reports is a dull headache over his right eye.  He states for the last month, usually in the middle of the day he will develop a pressure-like headache right above his right eye and in his right forehead.  It will usually last a few hours and then go away spontaneously.  He denies any sinus pressure.  He denies any rhinorrhea or head congestion.  He denies any sneezing.  He denies any otalgia.  He denies any blurry vision or double vision.  He denies any neurologic deficit.  He sleeps independently.  No one has ever told him that he grinds his teeth at night.  No one ever told him that he stops breathing or snores.  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  It does seem to be associated with heat.  Over the last week, he has not had a headache and it has been much cooler.  The patient does work as a Psychologist, sport and exercise.  He denies any chest pain shortness of breath dyspnea on exertion.  He does have some numbness and tingling in both feet.  He occasionally gets pins-and-needles.  Right foot is worse than the left foot.  He denies any polyuria polydipsia or blurry vision.  He is not checking his sugars.  At that time, my plan was: Patient declines flu shot today.  I will check a hemoglobin A1c.  Goal hemoglobin A1c is less than 6.5.  I will also check a CBC, CMP, fasting lipid panel, and urine microalbumin.  Diabetic foot exam is consistent with diabetic neuropathy with diminished sensation to 10 g monofilament.  Blood pressure today is adequately controlled.  I will check a fasting lipid panel.  Goal LDL  cholesterol is less than 100.  I believe his headaches likely tension headache due to grinding his teeth or possibly even dehydration.  I recommended trying Flexeril at night empirically to see if this is a muscle tension headache.  I also recommended that he try to drink more water and avoid heat.  If the headaches improve no further work-up is necessary.  If he continues to have a focal headache every day, I would recommend a CT scan of the brain.  11/20/20 Patient is a very pleasant 75 year old gentleman here today to follow-up his diabetes.  He is only taking Metformin.  He is not taking an aspirin or a statin.  He has not checked his blood sugar in quite some time.  He does have neuropathy in his distal toes bilaterally.  He is unable to appreciate a 10 g monofilament or a tuning fork on the plantar surfaces of his feet distal to the MTP joints.  He has palpable dorsalis pedis pulses and posterior tibialis pulses bilaterally.  There is no evidence of ulceration or infection.  He denies any chest pain shortness of breath or dyspnea on exertion.  He denies any polyuria, polydipsia, or blurry vision.  Past Medical History:  Diagnosis Date  . Arthritis   . Borderline diabetic   .  DVT (deep venous thrombosis) ([place]) 2012   right leg  . H/O bronchitis 25 years ago   x1  . History of kidney stones    x2   Past Surgical History:  Procedure Laterality Date  . HEMI-MICRODISCECTOMY LUMBAR LAMINECTOMY LEVEL 1 Right 06/02/2014   Procedure: LUMBAR LAMINECTOMY,CENTRAL  DECOMPRESSION, MICRODISCECTOMY, FORAMINOTOMY  LUMBAR FOUR TO LUMBAR FIVE RIGHT;  Surgeon: Tobi Bastos, MD;  Location: [facility];  Service: Orthopedics;  Laterality: Right;  . NO PAST SURGERIES     Current Outpatient Medications on File Prior to Visit  Medication Sig Dispense Refill  . cyclobenzaprine (FLEXERIL) 10 MG tablet TAKE 1 TABLET BY MOUTH EVERYDAY AT BEDTIME 30 tablet 0  . dapagliflozin propanediol (FARXIGA) 10 MG TABS tablet Take  10 mg by mouth daily. 30 tablet 3  . gabapentin (NEURONTIN) 300 MG capsule Take one at bedtime for 4 days. Then take one twice a day. 60 capsule 3  . ibuprofen (ADVIL) 600 MG tablet Take 1 tablet (600 mg total) by mouth every 8 (eight) hours as needed. 15 tablet 0  . metFORMIN (GLUCOPHAGE) 500 MG tablet TAKE 1 TABLET (500 MG TOTAL) BY MOUTH 2 (TWO) TIMES DAILY WITH A MEAL. 180 tablet 3  . ondansetron (ZOFRAN ODT) 8 MG disintegrating tablet Take 1 tablet (8 mg total) by mouth every 8 (eight) hours as needed for nausea or vomiting. 10 tablet 0  . tamsulosin (FLOMAX) 0.4 MG CAPS capsule Take 1 capsule (0.4 mg total) by mouth daily. 10 capsule 0   No current facility-administered medications on file prior to visit.   No Known Allergies Social History   Socioeconomic History  . Marital status: Married    Spouse name: Not on file  . Number of children: Not on file  . Years of education: Not on file  . Highest education level: Not on file  Occupational History  . Not on file  Tobacco Use  . Smoking status: Never Smoker  . Smokeless tobacco: Never Used  Vaping Use  . Vaping Use: Never used  Substance and Sexual Activity  . Alcohol use: No  . Drug use: No  . Sexual activity: Never    Birth control/protection: None  Other Topics Concern  . Not on file  Social History Narrative  . Not on file   Social Determinants of Health   Financial Resource Strain: Not on file  Food Insecurity: Not on file  Transportation Needs: Not on file  Physical Activity: Not on file  Stress: Not on file  Social Connections: Not on file  Intimate Partner Violence: Not on file     Review of Systems  All other systems reviewed and are negative.      Objective:   Physical Exam Vitals reviewed.  Constitutional:      General: He is not in acute distress.    Appearance: Normal appearance. He is not ill-appearing.  Cardiovascular:     Rate and Rhythm: Normal rate and regular rhythm.     Pulses:  Normal pulses.     Heart sounds: Normal heart sounds. No murmur heard. No friction rub. No gallop.   Pulmonary:     Effort: Pulmonary effort is normal. No respiratory distress.     Breath sounds: Normal breath sounds. No stridor. No wheezing, rhonchi or rales.  Chest:     Chest wall: No tenderness.  Abdominal:     General: Bowel sounds are normal. There is no distension.     Palpations: Abdomen is soft.  Tenderness: There is no abdominal tenderness. There is no guarding or rebound.  Musculoskeletal:     Right lower leg: No edema.     Left lower leg: No edema.  Neurological:     Mental Status: He is alert.           Assessment & Plan:  Type 2 diabetes mellitus without complication, without long-term current use of insulin (HCC) - Plan: Hemoglobin A1c, CBC with Differential/Platelet, COMPLETE METABOLIC PANEL WITH GFR, Lipid panel, Microalbumin, urine  Prostate cancer screening - Plan: PSA  I encouraged the patient to check his feet every day for any signs of infection or ulceration.  Blood pressure is excellent.  Recommended he take an aspirin 81 mg a day.  Also recommended a statin.  I will check an A1c, CBC, CMP, lipid panel, and urine microalbumin.  While the patient is here I will also check a PSA for prostate cancer screening.  Goal hemoglobin A1c is less than 6.5.  Last time we checked it it was 6.8.  I like his urine albumin to creatinine ratio to be less than 30.  I like his LDL cholesterol to be less than 100.  After the patient's visit had concluded, he asked me an additional question when I was walking by him in the lab.  He states that he is concerned about Parkinson's disease.  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  He has noticed a shaking tremor in his hand.  Is a little bit of a pill-rolling tremor.  He states that he can control it if he concentrates on it however he will noticed a slight rolling tremor in either hand when he is at rest.   He denies any falls or balance issues or bradykinesia.  There is no evidence of a masklike facies.  He has a normal blink reflex.  He has normal balance.  He denies any memory loss or hallucinations or confusion.  Therefore he does have a slight parkinsonian tremor but otherwise no symptoms.  Together we have decided to simply monitor this is at the present time I feel the medicine is not yet warranted due to the severity of his symptoms or lack thereof however we will continue to monitor this moving forward

## 2020-11-20 NOTE — Patient Instructions (Signed)
° ° ° °  If you have lab work done today you will be contacted with your lab results within the next 2 weeks.  If you have not heard from us then please contact us. The fastest way to get your results is to register for My Chart. ° ° °IF you received an x-ray today, you will receive an invoice from [facility]. Please contact [facility] at [phone] with questions or concerns regarding your invoice.  ° °IF you received labwork today, you will receive an invoice from [facility]. Please contact [facility] at [phone] with questions or concerns regarding your invoice.  ° °Our billing staff will not be able to assist you with questions regarding bills from these companies. ° °You will be contacted with the lab results as soon as they are available. The fastest way to get your results is to activate your My Chart account. Instructions are located on the last page of this paperwork. If you have not heard from us regarding the results in 2 weeks, please contact this office. °  ° ° ° °

## 2020-11-21 LAB — CBC WITH DIFFERENTIAL/PLATELET
Absolute Monocytes: 673 cells/uL (ref 200–950)
Basophils Absolute: 41 cells/uL (ref 0–200)
Basophils Relative: 0.6 %
Eosinophils Absolute: 211 cells/uL (ref 15–500)
Eosinophils Relative: 3.1 %
HCT: 42.8 % (ref 38.5–50.0)
Hemoglobin: 14.7 g/dL (ref 13.2–17.1)
Lymphs Abs: 1904 cells/uL (ref 850–3900)
MCH: 30.5 pg (ref 27.0–33.0)
MCHC: 34.3 g/dL (ref 32.0–36.0)
MCV: 88.8 fL (ref 80.0–100.0)
MPV: 10.5 fL (ref 7.5–12.5)
Monocytes Relative: 9.9 %
Neutro Abs: 3971 cells/uL (ref 1500–7800)
Neutrophils Relative %: 58.4 %
Platelets: 211 10*3/uL (ref 140–400)
RBC: 4.82 10*6/uL (ref 4.20–5.80)
RDW: 12.6 % (ref 11.0–15.0)
Total Lymphocyte: 28 %
WBC: 6.8 10*3/uL (ref 3.8–10.8)

## 2020-11-21 LAB — COMPLETE METABOLIC PANEL WITH GFR
AG Ratio: 1.5 (calc) (ref 1.0–2.5)
ALT: 19 U/L (ref 9–46)
AST: 15 U/L (ref 10–35)
Albumin: 4 g/dL (ref 3.6–5.1)
Alkaline phosphatase (APISO): 60 U/L (ref 35–144)
BUN: 18 mg/dL (ref 7–25)
CO2: 25 mmol/L (ref 20–32)
Calcium: 9.1 mg/dL (ref 8.6–10.3)
Chloride: 107 mmol/L (ref 98–110)
Creat: 0.95 mg/dL (ref 0.70–1.18)
GFR, Est African American: 90 mL/min/{1.73_m2} (ref >=60)
GFR, Est Non African American: 78 mL/min/{1.73_m2} (ref >=60)
Globulin: 2.6 g/dL (calc) (ref 1.9–3.7)
Glucose, Bld: 176 mg/dL — ABNORMAL HIGH (ref 65–99)
Potassium: 4.5 mmol/L (ref 3.5–5.3)
Sodium: 138 mmol/L (ref 135–146)
Total Bilirubin: 0.5 mg/dL (ref 0.2–1.2)
Total Protein: 6.6 g/dL (ref 6.1–8.1)

## 2020-11-21 LAB — LIPID PANEL
Cholesterol: 165 mg/dL (ref <200)
HDL: 42 mg/dL (ref >=40)
LDL Cholesterol (Calc): 96 mg/dL (calc)
Non-HDL Cholesterol (Calc): 123 mg/dL (calc) (ref <130)
Total CHOL/HDL Ratio: 3.9 (calc) (ref <5.0)
Triglycerides: 176 mg/dL — ABNORMAL HIGH (ref <150)

## 2020-11-21 LAB — HEMOGLOBIN A1C
Hgb A1c MFr Bld: 7.9 % of total Hgb — ABNORMAL HIGH (ref <5.7)
Mean Plasma Glucose: 180 mg/dL
eAG (mmol/L): 10 mmol/L

## 2020-11-21 LAB — MICROALBUMIN, URINE: Microalb, Ur: 0.8 mg/dL

## 2020-11-21 LAB — PSA: PSA: 2.34 ng/mL (ref <=4.0)

## 2020-11-22 ENCOUNTER — Other Ambulatory Visit: Payer: Self-pay

## 2020-11-22 DIAGNOSIS — E119 Type 2 diabetes mellitus without complications: Secondary | ICD-10-CM

## 2020-11-22 MED ORDER — EMPAGLIFLOZIN 25 MG PO TABS: 25.0000 mg | ORAL_TABLET | Freq: Every day | ORAL | 0 refills | Status: DC

## 2020-11-22 NOTE — Addendum Note (Signed)
Addended by: Amalia Hailey on: 11/22/2020 09:30 AM   Modules accepted: Orders

## 2020-12-06 ENCOUNTER — Telehealth: Payer: Self-pay | Admitting: *Deleted

## 2020-12-06 MED ORDER — PIOGLITAZONE HCL 30 MG PO TABS: 30.0000 mg | ORAL_TABLET | Freq: Every day | ORAL | 1 refills | Status: DC

## 2020-12-06 NOTE — Telephone Encounter (Signed)
Received fax requesting alternative to Jardiance.   Patient is uninsured at this time and out of pocket cost is $372.48.  Please advise.

## 2020-12-06 NOTE — Telephone Encounter (Signed)
DC jardiance and start actos 30 mg a day.

## 2020-12-06 NOTE — Telephone Encounter (Signed)
Call placed to patient and patient made aware.   Prescription sent to pharmacy.  

## 2020-12-13 ENCOUNTER — Telehealth: Payer: Self-pay

## 2020-12-13 DIAGNOSIS — R259 Unspecified abnormal involuntary movements: Secondary | ICD-10-CM

## 2020-12-13 NOTE — Telephone Encounter (Signed)
Call placed to patient and patient made aware.   Referral orders placed.  

## 2020-12-13 NOTE — Telephone Encounter (Signed)
[facility] would be my recommendation.

## 2020-12-18 NOTE — Progress Notes (Deleted)
Assessment/Plan:    *** -Patient has never had TSH checked but I can see in our system.  We will go ahead and check that.  Subjective:   Cameron Young was seen today in the movement disorders clinic for neurologic consultation at the request of Susy Frizzle, MD.  The consultation is for the evaluation of Parkinson's disease.  Records made available to me are reviewed.  Patient saw primary care on February 8 and mentioned tremor.  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  He called back after that visit and asked for a referral here for evaluation.   Specific Symptoms:  Tremor: {yes no:314532} Family hx of similar:  {yes no:314532} Voice: *** Sleep: ***  Vivid Dreams:  {yes no:314532}  Acting out dreams:  {yes no:314532} Wet Pillows: {yes no:314532} Postural symptoms:  {yes no:314532}  Falls?  {yes no:314532} Bradykinesia symptoms: {parkinson brady:18041} Loss of smell:  {yes no:314532} Loss of taste:  {yes no:314532} Urinary Incontinence:  {yes no:314532} Difficulty Swallowing:  {yes no:314532} Handwriting, micrographia: {yes no:314532} Trouble with ADL's:  {yes no:314532}  Trouble buttoning clothing: {yes no:314532} Depression:  {yes no:314532} Memory changes:  {yes no:314532} Hallucinations:  {yes no:314532}  visual distortions: {yes no:314532} N/V:  {yes no:314532} Lightheaded:  {yes no:314532}  Syncope: {yes no:314532} Diplopia:  {yes no:314532} Dyskinesia:  {yes no:314532} Prior exposure to reglan/antipsychotics: {yes no:314532}  Neuroimaging of the brain has not previously been performed.    PREVIOUS MEDICATIONS: {Parkinson's RX:18200}  ALLERGIES:  No Known Allergies  CURRENT MEDICATIONS:  Current Outpatient Medications  Medication Instructions  . cyclobenzaprine (FLEXERIL) 10 MG tablet TAKE 1 TABLET BY MOUTH EVERYDAY AT BEDTIME  . dapagliflozin propanediol (FARXIGA) 10 mg, Oral, Daily  . gabapentin (NEURONTIN) 300 MG  capsule Take one at bedtime for 4 days. Then take one twice a day.  . ibuprofen (ADVIL) 600 mg, Oral, Every 8 hours PRN  . metFORMIN (GLUCOPHAGE) 500 mg, Oral, 2 times daily with meals  . ondansetron (ZOFRAN ODT) 8 mg, Oral, Every 8 hours PRN  . pioglitazone (ACTOS) 30 mg, Oral, Daily  . tamsulosin (FLOMAX) 0.4 mg, Oral, Daily    Objective:   VITALS:  There were no vitals filed for this visit.  GEN:  The patient appears stated age and is in NAD. HEENT:  Normocephalic, atraumatic.  The mucous membranes are moist. The superficial temporal arteries are without ropiness or tenderness. CV:  RRR Lungs:  CTAB Neck/HEME:  There are no carotid bruits bilaterally.  Neurological examination:  Orientation: The patient is alert and oriented x3.  Cranial nerves: There is good facial symmetry. Extraocular muscles are intact. The visual fields are full to confrontational testing. The speech is fluent and clear. Soft palate rises symmetrically and there is no tongue deviation. Hearing is intact to conversational tone. Sensation: Sensation is intact to light and pinprick throughout (facial, trunk, extremities). Vibration is intact at the bilateral big toe. There is no extinction with double simultaneous stimulation. There is no sensory dermatomal level identified. Motor: Strength is 5/5 in the bilateral upper and lower extremities.   Shoulder shrug is equal and symmetric.  There is no pronator drift. Deep tendon reflexes: Deep tendon reflexes are 2/4 at the bilateral biceps, triceps, brachioradialis, patella and achilles. Plantar responses are downgoing bilaterally.  Movement examination: Tone: There is ***tone in the bilateral upper extremities.  The tone in the lower extremities is ***.  Abnormal movements: *** Coordination:  There is *** decremation  with RAM's, *** Gait and Station: The patient has *** difficulty arising out of a deep-seated chair without the use of the hands. The patient's stride  length is ***.  The patient has a *** pull test.     I have reviewed and interpreted the following labs independently   Chemistry      Component Value Date/Time   NA 138 11/20/2020 0836   K 4.5 11/20/2020 0836   CL 107 11/20/2020 0836   CO2 25 11/20/2020 0836   BUN 18 11/20/2020 0836   CREATININE 0.95 11/20/2020 0836      Component Value Date/Time   CALCIUM 9.1 11/20/2020 0836   ALKPHOS 53 01/31/2019 0851   AST 15 11/20/2020 0836   ALT 19 11/20/2020 0836   BILITOT 0.5 11/20/2020 0836      No results found for: TSH Lab Results  Component Value Date   WBC 6.8 11/20/2020   HGB 14.7 11/20/2020   HCT 42.8 11/20/2020   MCV 88.8 11/20/2020   PLT 211 11/20/2020     Total time spent on today's visit was ***60 minutes, including both face-to-face time and nonface-to-face time.  Time included that spent on review of records (prior notes available to me/labs/imaging if pertinent), discussing treatment and goals, answering patient's questions and coordinating care.  Cc:  Susy Frizzle, MD

## 2020-12-20 ENCOUNTER — Ambulatory Visit: Payer: Medicare Other | Admitting: Neurology

## 2020-12-20 ENCOUNTER — Encounter: Payer: Self-pay | Admitting: Neurology

## 2020-12-20 DIAGNOSIS — Z029 Encounter for administrative examinations, unspecified: Secondary | ICD-10-CM

## 2020-12-25 ENCOUNTER — Ambulatory Visit: Payer: Medicare Other | Admitting: Neurology

## 2021-01-11 ENCOUNTER — Other Ambulatory Visit: Payer: Self-pay | Admitting: Nurse Practitioner

## 2021-01-31 ENCOUNTER — Telehealth (INDEPENDENT_AMBULATORY_CARE_PROVIDER_SITE_OTHER): Payer: Medicare Other | Admitting: Nurse Practitioner

## 2021-01-31 ENCOUNTER — Encounter: Payer: Self-pay | Admitting: Nurse Practitioner

## 2021-01-31 ENCOUNTER — Other Ambulatory Visit: Payer: Self-pay

## 2021-01-31 DIAGNOSIS — Z0000 Encounter for general adult medical examination without abnormal findings: Secondary | ICD-10-CM | POA: Diagnosis not present

## 2021-01-31 NOTE — Progress Notes (Signed)
Patient: Cameron Young, Male    DOB: 1946-02-22, 75 y.o.   MRN: [id number]  Visit Date: 01/31/2021  Today's Provider: Eulogio Bear, NP   Chief Complaint  Patient presents with  . Medicare Wellness    No medical concerns, does not do vaccinations. Completed 6cit, functional status, ph92 and fall screen. No refill needed. Will bring copy of living will    Subjective:   Cameron Young is a 75 y.o. male who presents today for his Subsequent Annual Wellness Visit.  Caregiver input:  None  List of Providers: Dr. Jenna Luo, PCP  HPI   Diabetes - patient is currently taking Metformin 500 mg two times daily with meals and Actos 30 mg daily.  Tolerating these well.  No s/s hypoglycemia or hyperglycemia today.  Review of Systems  Constitutional: Negative.  Negative for activity change, appetite change, chills, diaphoresis and fatigue.  Endocrine: Negative.  Negative for polydipsia, polyphagia and polyuria.  Neurological: Negative.  Negative for dizziness and light-headedness.  Psychiatric/Behavioral: Negative.  Negative for decreased concentration, dysphoric mood, sleep disturbance and suicidal ideas. The patient is not nervous/anxious.     Past Medical History:  Diagnosis Date  . Arthritis   . Borderline diabetic   . DVT (deep venous thrombosis) ([place]) 2012   right leg  . H/O bronchitis 25 years ago   x1  . History of kidney stones    x2   Past Surgical History:  Procedure Laterality Date  . HEMI-MICRODISCECTOMY LUMBAR LAMINECTOMY LEVEL 1 Right 06/02/2014   Procedure: LUMBAR LAMINECTOMY,CENTRAL  DECOMPRESSION, MICRODISCECTOMY, FORAMINOTOMY  LUMBAR FOUR TO LUMBAR FIVE RIGHT;  Surgeon: Tobi Bastos, MD;  Location: [facility];  Service: Orthopedics;  Laterality: Right;  . NO PAST SURGERIES      History reviewed. No pertinent family history.  Social History   Socioeconomic History  . Marital status: Married    Spouse name: Not on file  . Number of children: Not on  file  . Years of education: Not on file  . Highest education level: Not on file  Occupational History  . Not on file  Tobacco Use  . Smoking status: Never Smoker  . Smokeless tobacco: Never Used  Vaping Use  . Vaping Use: Never used  Substance and Sexual Activity  . Alcohol use: No  . Drug use: No  . Sexual activity: Never    Birth control/protection: None  Other Topics Concern  . Not on file  Social History Narrative  . Not on file   Social Determinants of Health   Financial Resource Strain: Not on file  Food Insecurity: Not on file  Transportation Needs: Not on file  Physical Activity: Not on file  Stress: Not on file  Social Connections: Not on file  Intimate Partner Violence: Not on file    Outpatient Encounter Medications as of 01/31/2021  Medication Sig  . metFORMIN (GLUCOPHAGE) 500 MG tablet TAKE 1 TABLET (500 MG TOTAL) BY MOUTH 2 (TWO) TIMES DAILY WITH A MEAL.  . pioglitazone (ACTOS) 30 MG tablet Take 1 tablet (30 mg total) by mouth daily.  . [DISCONTINUED] cyclobenzaprine (FLEXERIL) 10 MG tablet TAKE 1 TABLET BY MOUTH EVERYDAY AT BEDTIME (Patient not taking: Reported on 11/20/2020)  . [DISCONTINUED] dapagliflozin propanediol (FARXIGA) 10 MG TABS tablet Take 10 mg by mouth daily. (Patient not taking: Reported on 11/20/2020)  . [DISCONTINUED] gabapentin (NEURONTIN) 300 MG capsule Take one at bedtime for 4 days. Then take one twice a day. (Patient not taking: Reported  on 11/20/2020)  . [DISCONTINUED] ibuprofen (ADVIL) 600 MG tablet Take 1 tablet (600 mg total) by mouth every 8 (eight) hours as needed. (Patient not taking: Reported on 11/20/2020)  . [DISCONTINUED] ondansetron (ZOFRAN ODT) 8 MG disintegrating tablet Take 1 tablet (8 mg total) by mouth every 8 (eight) hours as needed for nausea or vomiting. (Patient not taking: Reported on 11/20/2020)  . [DISCONTINUED] tamsulosin (FLOMAX) 0.4 MG CAPS capsule Take 1 capsule (0.4 mg total) by mouth daily. (Patient not taking: Reported  on 11/20/2020)   No facility-administered encounter medications on file as of 01/31/2021.    Functional Status Survey: Is the patient deaf or have difficulty hearing?: No Does the patient have difficulty seeing, even when wearing glasses/contacts?: No Does the patient have difficulty concentrating, remembering, or making decisions?: No Does the patient have difficulty walking or climbing stairs?: No Does the patient have difficulty dressing or bathing?: No Does the patient have difficulty doing errands alone such as visiting a doctor's office or shopping?: No  Fall Risk Assessment Fall Risk  01/31/2021 06/07/2018 09/01/2016  Falls in the past year? 0 No No  Number falls in past yr: 0 - -  Injury with Fall? 0 - -   Depression Screen Depression screen [facility] 2/9 01/31/2021 06/07/2018 09/01/2016  Decreased Interest 0 0 0  Down, Depressed, Hopeless 0 0 0  PHQ - 2 Score 0 0 0    6CIT Screen 01/31/2021 01/31/2021  What Year? 0 points 0 points  What month? 0 points 0 points  What time? 0 points 0 points  Count back from 20 0 points 0 points  Months in reverse 0 points 0 points  Repeat phrase 0 points 0 points  Total Score 0 0   Advanced Directives Does patient have a HCPOA?    no If yes, name and contact information:  Does patient have a living will or MOST form?  no  Objective:   Vitals: There were no vitals taken for this visit. There is no height or weight on file to calculate BMI. No exam data present  Physical Exam Constitutional:      General: He is not in acute distress.    Appearance: Normal appearance. He is not toxic-appearing.  Skin:    Coloration: Skin is not jaundiced or pale.     Findings: No erythema.  Neurological:     Mental Status: He is alert and oriented to person, place, and time.  Psychiatric:        Mood and Affect: Mood normal.        Behavior: Behavior normal.        Thought Content: Thought content normal.        Judgment: Judgment normal.     Assessment & Plan:     Annual Wellness Visit  Reviewed patient's Family Medical History Reviewed and updated list of patient's medical providers Assessment of cognitive impairment was done Assessed patient's functional ability Established a written schedule for health screening services  Patient declines colonoscopy and all vaccinations  Will provide patient with living will resources. Health Risk Assessent Completed and Reviewed     Exercise Activities and Dietary recommendations Goals   None     Immunization History  Administered Date(s) Administered  . Pneumococcal Polysaccharide-23 12/02/2018    Health Maintenance  Topic Date Due  . COLONOSCOPY (Pts 45-7yrs Insurance coverage will need to be confirmed)  Never done  . COVID-19 Vaccine (1) 02/16/2021 (Originally 09/05/1951)  . TETANUS/TDAP  11/20/2021 (Originally 09/05/1964)  .  Hepatitis C Screening  01/31/2022 (Originally [birth year])  . PNA vac Low Risk Adult (2 of 2 - PCV13) 01/31/2022 (Originally 12/03/2019)  . INFLUENZA VACCINE  05/13/2021  . URINE MICROALBUMIN  11/20/2021  . HPV VACCINES  Aged Out    Discussed health benefits of physical activity, and encouraged him to engage in regular exercise appropriate for his age and condition.   No orders of the defined types were placed in this encounter.   Current Outpatient Medications:  .  metFORMIN (GLUCOPHAGE) 500 MG tablet, TAKE 1 TABLET (500 MG TOTAL) BY MOUTH 2 (TWO) TIMES DAILY WITH A MEAL., Disp: 180 tablet, Rfl: 3 .  pioglitazone (ACTOS) 30 MG tablet, Take 1 tablet (30 mg total) by mouth daily., Disp: 90 tablet, Rfl: 1 Medications Discontinued During This Encounter  Medication Reason  . cyclobenzaprine (FLEXERIL) 10 MG tablet Error  . dapagliflozin propanediol (FARXIGA) 10 MG TABS tablet Error  . gabapentin (NEURONTIN) 300 MG capsule Error  . ibuprofen (ADVIL) 600 MG tablet Error  . ondansetron (ZOFRAN ODT) 8 MG disintegrating tablet Error  .  tamsulosin (FLOMAX) 0.4 MG CAPS capsule Error    Next Medicare Wellness Visit in 12+ months  This visit was completed via telephone due to the restrictions of the COVID-19 pandemic. All issues as above were discussed and addressed but no physical exam was performed. If it was felt that the patient should be evaluated in the office, they were directed there. The patient verbally consented to this visit. Patient was unable to complete an audio/visual visit due to Technical difficulties.  Visit started as a video visit, however I was unable to hear the patient.  Visit was then transitioned to a telephone call. . Location of the patient: home . Location of the provider: work . Those involved with this call:  . Provider: Noemi Chapel, DNP, FNP-C . CMA: Annabelle Harman, CMA . Front Desk/Registration: Vevelyn Pat  . Time spent on call: 16 minutes on the phone discussing health concerns. 14 minutes total spent in review of patient's record and preparation of their chart.  I verified patient identity using two factors (patient name and date of birth). Patient consents verbally to being seen via telemedicine visit today.

## 2021-01-31 NOTE — Addendum Note (Signed)
Addended by: Amalia Hailey on: 01/31/2021 01:31 PM   Modules accepted: Orders

## 2021-05-02 ENCOUNTER — Other Ambulatory Visit: Payer: Self-pay | Admitting: Family Medicine

## 2021-05-09 ENCOUNTER — Ambulatory Visit: Payer: Medicare Other | Admitting: Family Medicine

## 2021-05-16 ENCOUNTER — Ambulatory Visit (INDEPENDENT_AMBULATORY_CARE_PROVIDER_SITE_OTHER): Payer: Medicare Other | Admitting: Family Medicine

## 2021-05-16 ENCOUNTER — Other Ambulatory Visit: Payer: Self-pay

## 2021-05-16 ENCOUNTER — Encounter: Payer: Self-pay | Admitting: Family Medicine

## 2021-05-16 VITALS — BP 130/62 | HR 86 | Temp 98.2°F | Resp 16 | Ht 73.0 in | Wt 231.0 lb

## 2021-05-16 DIAGNOSIS — E119 Type 2 diabetes mellitus without complications: Secondary | ICD-10-CM

## 2021-05-16 MED ORDER — TERBINAFINE HCL 250 MG PO TABS: 250.0000 mg | ORAL_TABLET | Freq: Every day | ORAL | 2 refills | Status: DC

## 2021-05-16 NOTE — Progress Notes (Signed)
Subjective:    Patient ID: Cameron Young, male    DOB: 24-Nov-1945, 75 y.o.   MRN: [id number]  Medication Refill   Patient is here today for a follow-up of his diabetes.  After his last visit, we added Farxiga to his metformin due to an elevated hemoglobin A1c.  He has not been checking his blood sugar.  He denies any symptoms of hypoglycemia.  In fact the only problem he reports is a dull headache over his right eye.  He states for the last month, usually in the middle of the day he will develop a pressure-like headache right above his right eye and in his right forehead.  It will usually last a few hours and then go away spontaneously.  He denies any sinus pressure.  He denies any rhinorrhea or head congestion.  He denies any sneezing.  He denies any otalgia.  He denies any blurry vision or double vision.  He denies any neurologic deficit.  He sleeps independently.  No one has ever told him that he grinds his teeth at night.  No one ever told him that he stops breathing or snores.  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  It does seem to be associated with heat.  Over the last week, he has not had a headache and it has been much cooler.  The patient does work as a Psychologist, sport and exercise.  He denies any chest pain shortness of breath dyspnea on exertion.  He does have some numbness and tingling in both feet.  He occasionally gets pins-and-needles.  Right foot is worse than the left foot.  He denies any polyuria polydipsia or blurry vision.  He is not checking his sugars.  At that time, my plan was: Patient declines flu shot today.  I will check a hemoglobin A1c.  Goal hemoglobin A1c is less than 6.5.  I will also check a CBC, CMP, fasting lipid panel, and urine microalbumin.  Diabetic foot exam is consistent with diabetic neuropathy with diminished sensation to 10 g monofilament.  Blood pressure today is adequately controlled.  I will check a fasting lipid panel.  Goal LDL  cholesterol is less than 100.  I believe his headaches likely tension headache due to grinding his teeth or possibly even dehydration.  I recommended trying Flexeril at night empirically to see if this is a muscle tension headache.  I also recommended that he try to drink more water and avoid heat.  If the headaches improve no further work-up is necessary.  If he continues to have a focal headache every day, I would recommend a CT scan of the brain.  11/20/20 Patient is a very pleasant 75 year old gentleman here today to follow-up his diabetes.  He is only taking Metformin.  He is not taking an aspirin or a statin.  He has not checked his blood sugar in quite some time.  He does have neuropathy in his distal toes bilaterally.  He is unable to appreciate a 10 g monofilament or a tuning fork on the plantar surfaces of his feet distal to the MTP joints.  He has palpable dorsalis pedis pulses and posterior tibialis pulses bilaterally.  There is no evidence of ulceration or infection.  He denies any chest pain shortness of breath or dyspnea on exertion.  He denies any polyuria, polydipsia, or blurry vision.  At that time, my plan was: I encouraged the patient to check his feet every day  for any signs of infection or ulceration.  Blood pressure is excellent.  Recommended he take an aspirin 81 mg a day.  Also recommended a statin.  I will check an A1c, CBC, CMP, lipid panel, and urine microalbumin.  While the patient is here I will also check a PSA for prostate cancer screening.  Goal hemoglobin A1c is less than 6.5.  Last time we checked it it was 6.8.  I like his urine albumin to creatinine ratio to be less than 30.  I like his LDL cholesterol to be less than 100.  After the patient's visit had concluded, he asked me an additional question when I was walking by him in the lab.  He states that he is concerned about Parkinson's disease.  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  He has  noticed a shaking tremor in his hand.  Is a little bit of a pill-rolling tremor.  He states that he can control it if he concentrates on it however he will noticed a slight rolling tremor in either hand when he is at rest.  He denies any falls or balance issues or bradykinesia.  There is no evidence of a masklike facies.  He has a normal blink reflex.  He has normal balance.  He denies any memory loss or hallucinations or confusion.  Therefore he does have a slight parkinsonian tremor but otherwise no symptoms.  Together we have decided to simply monitor this is at the present time I feel the medicine is not yet warranted due to the severity of his symptoms or lack thereof however we will continue to monitor this moving forward  05/16/21 A1c was 7.9.  Recommended adding jardiance to metformin.  Due to insurance issues, the Vania Rea was going to cost more than $370.  Therefore he requested an alternative.  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  He denies any side effects on the medication.  He does have trace bipedal edema but is not bothering him.  He denies any hematuria or dysuria.  He denies any shortness of breath or chest pain.  He does have onychomycosis on all the toenails of his feet and he is requesting treatment  Past Medical History:  Diagnosis Date   Arthritis    Borderline diabetic    DVT (deep venous thrombosis) ([place]) 2012   right leg   H/O bronchitis 25 years ago   x1   History of kidney stones    x2   Past Surgical History:  Procedure Laterality Date   HEMI-MICRODISCECTOMY LUMBAR LAMINECTOMY LEVEL 1 Right 06/02/2014   Procedure: LUMBAR LAMINECTOMY,CENTRAL  DECOMPRESSION, MICRODISCECTOMY, FORAMINOTOMY  LUMBAR FOUR TO LUMBAR FIVE RIGHT;  Surgeon: Tobi Bastos, MD;  Location: [facility];  Service: Orthopedics;  Laterality: Right;   NO PAST SURGERIES     Current  Outpatient Medications on File Prior to Visit  Medication Sig Dispense Refill   metFORMIN (GLUCOPHAGE) 500 MG tablet TAKE 1 TABLET (500 MG TOTAL) BY MOUTH 2 (TWO) TIMES DAILY WITH A MEAL. 180 tablet 3   pioglitazone (ACTOS) 30 MG tablet TAKE 1 TABLET BY MOUTH EVERY DAY 90 tablet 1   No current facility-administered medications on file prior to visit.   No Known Allergies Social History   Socioeconomic History   Marital status: Married    Spouse name: Not  on file   Number of children: Not on file   Years of education: Not on file   Highest education level: Not on file  Occupational History   Not on file  Tobacco Use   Smoking status: Never   Smokeless tobacco: Never  Vaping Use   Vaping Use: Never used  Substance and Sexual Activity   Alcohol use: No   Drug use: No   Sexual activity: Never    Birth control/protection: None  Other Topics Concern   Not on file  Social History Narrative   Not on file   Social Determinants of Health   Financial Resource Strain: Not on file  Food Insecurity: Not on file  Transportation Needs: Not on file  Physical Activity: Not on file  Stress: Not on file  Social Connections: Not on file  Intimate Partner Violence: Not on file     Review of Systems  All other systems reviewed and are negative.     Objective:   Physical Exam Vitals reviewed.  Constitutional:      General: He is not in acute distress.    Appearance: Normal appearance. He is not ill-appearing.  Cardiovascular:     Rate and Rhythm: Normal rate and regular rhythm.     Pulses: Normal pulses.     Heart sounds: Normal heart sounds. No murmur heard.   No friction rub. No gallop.  Pulmonary:     Effort: Pulmonary effort is normal. No respiratory distress.     Breath sounds: Normal breath sounds. No stridor. No wheezing, rhonchi or rales.  Chest:     Chest wall: No tenderness.  Abdominal:     General: Bowel sounds are normal. There is no distension.     Palpations:  Abdomen is soft.     Tenderness: There is no abdominal tenderness. There is no guarding or rebound.  Musculoskeletal:     Right lower leg: Edema present.     Left lower leg: Edema present.  Neurological:     Mental Status: He is alert.          Assessment & Plan:  Type 2 diabetes mellitus without complication, without long-term current use of insulin ([place]) - Plan: CBC with Differential/Platelet, COMPLETE METABOLIC PANEL WITH GFR, Microalbumin, urine, Hemoglobin A1c I will check an A1c today.  Goal A1c is less than 6.5.  However I will be willing to accept an A1c under 7 given his age.  We will treat the onychomycosis with Lamisil 250 mg p.o. daily for up to 3 months.  I did explain to the patient that we need to check his liver function test monthly while he is on the medication to rule out drug-induced hepatitis.  Check urine microalbumin as well.  Blood pressure today is outstanding at 130/62.  At the present time, the tremor that he noticed at his last visit has not progressed.  Today I did not witness any tremor.  There may be a faint essential tremor on exam.  We discussed the pill-rolling tremor.  We also discussed bradykinesia, masklike facies, a staggering gait, loss of balance, and memory loss.  He denies any of the symptoms at the present time.  Therefore I recommended that we continue to monitor him clinically for now.

## 2021-05-17 ENCOUNTER — Encounter: Payer: Self-pay | Admitting: *Deleted

## 2021-05-17 LAB — COMPLETE METABOLIC PANEL WITH GFR
AG Ratio: 1.8 (calc) (ref 1.0–2.5)
ALT: 17 U/L (ref 9–46)
AST: 14 U/L (ref 10–35)
Albumin: 4.4 g/dL (ref 3.6–5.1)
Alkaline phosphatase (APISO): 50 U/L (ref 35–144)
BUN: 19 mg/dL (ref 7–25)
CO2: 24 mmol/L (ref 20–32)
Calcium: 9.7 mg/dL (ref 8.6–10.3)
Chloride: 105 mmol/L (ref 98–110)
Creat: 1.23 mg/dL (ref 0.70–1.28)
Globulin: 2.5 g/dL (calc) (ref 1.9–3.7)
Glucose, Bld: 152 mg/dL — ABNORMAL HIGH (ref 65–99)
Potassium: 4.4 mmol/L (ref 3.5–5.3)
Sodium: 141 mmol/L (ref 135–146)
Total Bilirubin: 0.7 mg/dL (ref 0.2–1.2)
Total Protein: 6.9 g/dL (ref 6.1–8.1)
eGFR: 61 mL/min/{1.73_m2} (ref >=60)

## 2021-05-17 LAB — CBC WITH DIFFERENTIAL/PLATELET
Absolute Monocytes: 810 cells/uL (ref 200–950)
Basophils Absolute: 37 cells/uL (ref 0–200)
Basophils Relative: 0.4 %
Eosinophils Absolute: 267 cells/uL (ref 15–500)
Eosinophils Relative: 2.9 %
HCT: 42.3 % (ref 38.5–50.0)
Hemoglobin: 14.1 g/dL (ref 13.2–17.1)
Lymphs Abs: 2208 cells/uL (ref 850–3900)
MCH: 30.7 pg (ref 27.0–33.0)
MCHC: 33.3 g/dL (ref 32.0–36.0)
MCV: 92 fL (ref 80.0–100.0)
MPV: 10.8 fL (ref 7.5–12.5)
Monocytes Relative: 8.8 %
Neutro Abs: 5879 cells/uL (ref 1500–7800)
Neutrophils Relative %: 63.9 %
Platelets: 226 10*3/uL (ref 140–400)
RBC: 4.6 10*6/uL (ref 4.20–5.80)
RDW: 12.9 % (ref 11.0–15.0)
Total Lymphocyte: 24 %
WBC: 9.2 10*3/uL (ref 3.8–10.8)

## 2021-05-17 LAB — HEMOGLOBIN A1C
Hgb A1c MFr Bld: 7 % of total Hgb — ABNORMAL HIGH (ref <5.7)
Mean Plasma Glucose: 154 mg/dL
eAG (mmol/L): 8.5 mmol/L

## 2021-05-17 LAB — MICROALBUMIN, URINE: Microalb, Ur: 1.2 mg/dL

## 2021-06-01 ENCOUNTER — Other Ambulatory Visit: Payer: Self-pay | Admitting: Family Medicine

## 2021-06-18 ENCOUNTER — Other Ambulatory Visit: Payer: Self-pay

## 2021-06-18 ENCOUNTER — Other Ambulatory Visit: Payer: Medicare Other

## 2021-06-18 DIAGNOSIS — E119 Type 2 diabetes mellitus without complications: Secondary | ICD-10-CM

## 2021-06-19 LAB — LIPID PANEL
Cholesterol: 199 mg/dL (ref <200)
HDL: 47 mg/dL (ref >=40)
LDL Cholesterol (Calc): 120 mg/dL (calc) — ABNORMAL HIGH
Non-HDL Cholesterol (Calc): 152 mg/dL (calc) — ABNORMAL HIGH (ref <130)
Total CHOL/HDL Ratio: 4.2 (calc) (ref <5.0)
Triglycerides: 203 mg/dL — ABNORMAL HIGH (ref <150)

## 2021-06-19 LAB — COMPLETE METABOLIC PANEL WITH GFR
AG Ratio: 1.5 (calc) (ref 1.0–2.5)
ALT: 14 U/L (ref 9–46)
AST: 12 U/L (ref 10–35)
Albumin: 4.2 g/dL (ref 3.6–5.1)
Alkaline phosphatase (APISO): 48 U/L (ref 35–144)
BUN: 19 mg/dL (ref 7–25)
CO2: 26 mmol/L (ref 20–32)
Calcium: 9.3 mg/dL (ref 8.6–10.3)
Chloride: 104 mmol/L (ref 98–110)
Creat: 1.05 mg/dL (ref 0.70–1.28)
Globulin: 2.8 g/dL (calc) (ref 1.9–3.7)
Glucose, Bld: 155 mg/dL — ABNORMAL HIGH (ref 65–99)
Potassium: 4.5 mmol/L (ref 3.5–5.3)
Sodium: 138 mmol/L (ref 135–146)
Total Bilirubin: 0.4 mg/dL (ref 0.2–1.2)
Total Protein: 7 g/dL (ref 6.1–8.1)
eGFR: 74 mL/min/{1.73_m2} (ref >=60)

## 2021-06-19 LAB — HEMOGLOBIN A1C
Hgb A1c MFr Bld: 7.1 % of total Hgb — ABNORMAL HIGH (ref <5.7)
Mean Plasma Glucose: 157 mg/dL
eAG (mmol/L): 8.7 mmol/L

## 2021-06-26 ENCOUNTER — Other Ambulatory Visit: Payer: Self-pay | Admitting: *Deleted

## 2021-06-26 MED ORDER — ROSUVASTATIN CALCIUM 10 MG PO TABS: 10.0000 mg | ORAL_TABLET | Freq: Every day | ORAL | 3 refills | Status: DC

## 2021-06-26 MED ORDER — METFORMIN HCL 500 MG PO TABS: 1000.0000 mg | ORAL_TABLET | Freq: Two times a day (BID) | ORAL | 2 refills | Status: DC

## 2021-07-22 ENCOUNTER — Telehealth: Payer: Self-pay | Admitting: Family Medicine

## 2021-07-22 NOTE — Telephone Encounter (Signed)
I do not see a referral for an eye dr.   Please advise.

## 2021-07-22 NOTE — Telephone Encounter (Signed)
Patient's spouse Wells Guiles requesting name and contact number of eye doctor patient was referred to . Please advise at [phone].

## 2021-07-23 ENCOUNTER — Other Ambulatory Visit: Payer: Self-pay | Admitting: Family Medicine

## 2021-07-23 DIAGNOSIS — E118 Type 2 diabetes mellitus with unspecified complications: Secondary | ICD-10-CM

## 2021-07-23 NOTE — Telephone Encounter (Signed)
Outbound call placed; spoke with patient's spouse Wells Guiles to advise that Dr. Dennard Schaumann sent a referral since one wasn't in the records. Also gave patient number of referral coordinator for follow up.

## 2021-07-26 DIAGNOSIS — E119 Type 2 diabetes mellitus without complications: Secondary | ICD-10-CM | POA: Diagnosis not present

## 2021-07-26 DIAGNOSIS — H2513 Age-related nuclear cataract, bilateral: Secondary | ICD-10-CM | POA: Diagnosis not present

## 2021-07-26 DIAGNOSIS — H52203 Unspecified astigmatism, bilateral: Secondary | ICD-10-CM | POA: Diagnosis not present

## 2021-11-15 ENCOUNTER — Encounter: Payer: Self-pay | Admitting: Family Medicine

## 2021-11-15 ENCOUNTER — Ambulatory Visit (INDEPENDENT_AMBULATORY_CARE_PROVIDER_SITE_OTHER): Payer: Medicare Other | Admitting: Family Medicine

## 2021-11-15 ENCOUNTER — Other Ambulatory Visit: Payer: Self-pay

## 2021-11-15 VITALS — BP 124/72 | HR 82 | Temp 97.4°F | Resp 18 | Ht 73.0 in | Wt 245.0 lb

## 2021-11-15 DIAGNOSIS — R251 Tremor, unspecified: Secondary | ICD-10-CM | POA: Diagnosis not present

## 2021-11-15 NOTE — Progress Notes (Signed)
Subjective:    Patient ID: Cameron Young, male    DOB: [birth year], 76 y.o.   MRN: [id number]  Patient is here today requesting a referral back to see a neurologist.  He is concerned about possible Parkinson's disease.  He has developed a tremor in his right hand.  In the past, it had a pill-rolling quality.  Today it seems more like essential tremor due to the fact that seems to be a high-frequency side to side motion which is very fine and hard to notice.  It occurs in both hands.  He denies any falls or balance issues or bradykinesia.  There is no evidence of a masklike facies.  He has a normal blink reflex.  He has normal balance.  He denies any memory loss or hallucinations or confusion.  Today in office, I perform a Romberg testing.  This is completely normal.  Finger-nose testing is normal.  There is no cogwheel rigidity.  I performed heel-to-toe walk in the hallway.  The patient staggers to both sides and has to lean against the wall to avoid falling.  I feel that this is most likely due to his neuropathy in his feet as he has decreased sensation in his feet.  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 Past Medical History:  Diagnosis Date   Arthritis    Borderline diabetic    DVT (deep venous thrombosis) ([place]) 2012   right leg   H/O bronchitis 25 years ago   x1   History of kidney stones    x2   Past Surgical History:  Procedure Laterality Date   HEMI-MICRODISCECTOMY LUMBAR LAMINECTOMY LEVEL 1 Right 06/02/2014   Procedure: LUMBAR LAMINECTOMY,CENTRAL  DECOMPRESSION, MICRODISCECTOMY, FORAMINOTOMY  LUMBAR FOUR TO LUMBAR FIVE RIGHT;  Surgeon: Tobi Bastos, MD;  Location: [facility];  Service: Orthopedics;  Laterality: Right;   NO PAST SURGERIES     Current Outpatient Medications on File Prior to Visit  Medication Sig Dispense Refill   metFORMIN (GLUCOPHAGE) 500 MG tablet Take 2 tablets (1,000 mg total) by mouth  2 (two) times daily with a meal. 360 tablet 2   rosuvastatin (CRESTOR) 10 MG tablet Take 1 tablet (10 mg total) by mouth daily. 90 tablet 3   pioglitazone (ACTOS) 30 MG tablet TAKE 1 TABLET BY MOUTH EVERY DAY (Patient not taking: Reported on 11/15/2021) 90 tablet 1   No current facility-administered medications on file prior to visit.   No Known Allergies Social History   Socioeconomic History   Marital status: Married    Spouse name: Not on file   Number of children: Not on file   Years of education: Not on file   Highest education level: Not on file  Occupational History   Not on file  Tobacco Use   Smoking status: Never   Smokeless tobacco: Never  Vaping Use   Vaping Use: Never used  Substance and Sexual Activity   Alcohol use: No   Drug use: No   Sexual activity: Never    Birth control/protection: None  Other Topics Concern   Not on file  Social History Narrative   Not on file   Social Determinants of Health   Financial Resource Strain: Not on file  Food Insecurity: Not on file  Transportation Needs: Not on file  Physical Activity: Not on file  Stress: Not on file  Social Connections: Not on file  Intimate  Partner Violence: Not on file     Review of Systems  All other systems reviewed and are negative.     Objective:   Physical Exam Vitals reviewed.  Constitutional:      General: He is not in acute distress.    Appearance: Normal appearance. He is not ill-appearing.  Cardiovascular:     Rate and Rhythm: Normal rate and regular rhythm.     Pulses: Normal pulses.     Heart sounds: Normal heart sounds. No murmur heard.   No friction rub. No gallop.  Pulmonary:     Effort: Pulmonary effort is normal. No respiratory distress.     Breath sounds: Normal breath sounds. No stridor. No wheezing, rhonchi or rales.  Chest:     Chest wall: No tenderness.  Abdominal:     General: Bowel sounds are normal. There is no distension.     Palpations: Abdomen is soft.      Tenderness: There is no abdominal tenderness. There is no guarding or rebound.  Musculoskeletal:     Right lower leg: Edema present.     Left lower leg: Edema present.  Neurological:     Mental Status: He is alert.          Assessment & Plan:  Tremor - Plan: Ambulatory referral to Neurology Patient has peripheral neuropathy and performs poorly on heel-to-toe walk due to his peripheral neuropathy.  There is no evidence of stroke or neurologic deficit.  He does have a fine resting tremor but I see no pill-rolling quality today.  He does not have any bradykinesia, masklike facies, decreased blink reflex, or memory loss.  Therefore I tried to reassure the patient that I do not feel that he has parkinsonian features.  However I will gladly set him up to see a neurologist for a second opinion.

## 2021-11-19 NOTE — Progress Notes (Signed)
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  It is incredibly mild.  He does not otherwise meet criteria for Parkinson's disease.  He really is not particularly bradykinetic, which is required for the diagnosis.  He and I discussed this.  I did offer him DaTscan and that was declined.  Discussed skin biopsy for alpha-synuclein.  Again, we discussed that this really would not change what we did clinically.  Ultimately, he just decided to follow here and watch for any new developments.  I think that is very reasonable.  I will plan on seeing him back in about 9 months, sooner should new neurologic issues arise. 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  Patient reports diabetes has been under fairly good control lately.  Subjective:   Cameron Young was seen today in the movement disorders clinic for neurologic consultation at the request of Susy Frizzle, MD.  The consultation is for the evaluation of tremor.  He was referred about a year ago for the same, thought at that time to be possible Parkinson's disease.  He came to the office about a year ago, but patient declined the visit because of the mask policy at [facility].  He was referred back a few days ago.  Notes are reviewed.  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  Tremor: Yes.     How long has it been going on? 6-8 months  At rest or with activation?  Activation but "I don't notice it that much."  Fam hx of tremor?  No.  Located where?  Bilateral UE  Affected by caffeine:  doesn't drink much caffeine  Affected by alcohol:  doesn't drink enough to know  Affected by stress:  No.  Affected by fatigue:  No.  Spills soup if on spoon:  No.  Spills glass of liquid if full:  No.  Affects ADL's (tying  shoes, brushing teeth, etc):  No.  Tremor inducing meds:  No.  Other Specific Symptoms:  Voice: no change Postural symptoms:  Yes.  , he attributes that to neuropathy - "my feet are numb." States unknown cause but he is diabetic  Falls?  No. Bradykinesia symptoms: no bradykinesia noted Loss of smell:  No. Loss of taste:  No. Urinary Incontinence:  No. Difficulty Swallowing:  No. Handwriting, micrographia: No. Memory changes:  Yes.   - just trouble with names (finances, pills, ok) N/V:  No. Lightheaded:  No.  Syncope: No. Diplopia:  No. Dyskinesia:  No.  Neuroimaging of the brain has not previously been performed.     ALLERGIES:  No Known Allergies  CURRENT MEDICATIONS:  Current Outpatient Medications  Medication Instructions   metFORMIN (GLUCOPHAGE) 1,000 mg, Oral, 2 times daily with meals   pioglitazone (ACTOS) 30 MG tablet TAKE 1 TABLET BY MOUTH EVERY DAY   rosuvastatin (CRESTOR) 10 mg, Oral, Daily    Objective:   PHYSICAL EXAMINATION:    VITALS:   Vitals:   11/21/21 0934  BP: (!) 162/91  Pulse: 93  SpO2: 95%  Weight: 243 lb 9.6 oz (110.5 kg)  Height: 6\' 1"  (1.854 m)    GEN:  The patient appears stated age and is in NAD. HEENT:  Normocephalic, atraumatic.  The mucous membranes are moist. The superficial temporal arteries  are without ropiness or tenderness. CV:  RRR Lungs:  CTAB Neck/HEME:  There are no carotid bruits bilaterally.  Neurological examination:  Orientation: The patient is alert and oriented x3.  Cranial nerves: There is good facial symmetry.  No facial hypomimia extraocular muscles are intact. The visual fields are full to confrontational testing. The speech is fluent and clear. Soft palate rises symmetrically and there is no tongue deviation. Hearing is intact to conversational tone. Sensation: Sensation is intact to light touch throughout (facial, trunk, extremities). Vibration is absent at the bilateral big toe and knee. There is no  extinction with double simultaneous stimulation.  Motor: Strength is 5/5 in the bilateral upper and lower extremities.   Shoulder shrug is equal and symmetric.  There is no pronator drift. Deep tendon reflexes: Deep tendon reflexes are 0-1/4 at the bilateral biceps, triceps, brachioradialis, patella and achilles. Plantar responses are downgoing bilaterally.  Movement examination: Tone: There is ?  Increased tone versus paratonia in the left upper extremity.  Tone elsewhere is normal. Abnormal movements: there is intermittent LUE rest tremor and rare RUE rest tremor (only with writing with the L hand was it seen but it could be felt).  Minimal postural and intention tremor.  No significant trouble with Archimedes spirals.  No significant trouble pouring water from 1 glass to another (with the exception of the fact he was a little careless and spilled some because of that). Coordination:  There is no decremation with RAM's, with any form of RAMS, including alternating supination and pronation of the forearm, hand opening and closing, finger taps, heel taps and toe taps. Gait and Station: The patient has no difficulty arising out of a deep-seated chair without the use of the hands. The patient's stride length is good.  No tremor with ambulation.  No decreased arm swing.  No shuffling.   I have reviewed and interpreted the following labs independently   Chemistry      Component Value Date/Time   NA 138 06/18/2021 0804   K 4.5 06/18/2021 0804   CL 104 06/18/2021 0804   CO2 26 06/18/2021 0804   BUN 19 06/18/2021 0804   CREATININE 1.05 06/18/2021 0804      Component Value Date/Time   CALCIUM 9.3 06/18/2021 0804   ALKPHOS 53 01/31/2019 0851   AST 12 06/18/2021 0804   ALT 14 06/18/2021 0804   BILITOT 0.4 06/18/2021 0804      No results found for: TSH Lab Results  Component Value Date   WBC 9.2 05/16/2021   HGB 14.1 05/16/2021   HCT 42.3 05/16/2021   MCV 92.0 05/16/2021   PLT 226 05/16/2021     Lab Results  Component Value Date   HGBA1C 7.1 (H) 06/18/2021     Total time spent on today's visit was 67minutes, including both face-to-face time and nonface-to-face time.  Time included that spent on review of records (prior notes available to me/labs/imaging if pertinent), discussing treatment and goals, answering patient's questions and coordinating care.  Cc:  Susy Frizzle, MD

## 2021-11-21 ENCOUNTER — Ambulatory Visit (INDEPENDENT_AMBULATORY_CARE_PROVIDER_SITE_OTHER): Payer: Medicare Other | Admitting: Neurology

## 2021-11-21 ENCOUNTER — Encounter: Payer: Self-pay | Admitting: Neurology

## 2021-11-21 ENCOUNTER — Other Ambulatory Visit: Payer: Self-pay

## 2021-11-21 VITALS — BP 162/91 | HR 93 | Ht 73.0 in | Wt 243.6 lb

## 2021-11-21 DIAGNOSIS — E1142 Type 2 diabetes mellitus with diabetic polyneuropathy: Secondary | ICD-10-CM | POA: Diagnosis not present

## 2021-11-21 DIAGNOSIS — R251 Tremor, unspecified: Secondary | ICD-10-CM | POA: Diagnosis not present

## 2022-01-02 ENCOUNTER — Telehealth: Payer: Self-pay | Admitting: Family Medicine

## 2022-01-02 NOTE — Telephone Encounter (Signed)
Patient's spouse came to the office to request recent refills be sent to a different pharmacy for lower pricing; meds [facility] cost [facility] at [facility] .  ? ?Scripts never picked up from pharmacy for the following meds: ? ?metFORMIN (GLUCOPHAGE) 500 MG tablet [[id number]]  ? ?pioglitazone (ACTOS) 30 MG tablet [[id number]]  ? ?Requesting for scripts to be canceled at [facility] on [address] and called into [facility] in [place] where they'll cost $17. ? ? ?Please advise at [phone]. ?

## 2022-01-03 MED ORDER — METFORMIN HCL 500 MG PO TABS: 1000.0000 mg | ORAL_TABLET | Freq: Two times a day (BID) | ORAL | 2 refills | Status: DC

## 2022-01-03 MED ORDER — PIOGLITAZONE HCL 30 MG PO TABS: 30.0000 mg | ORAL_TABLET | Freq: Every day | ORAL | 2 refills | Status: DC

## 2022-01-03 NOTE — Telephone Encounter (Signed)
Rx sent to pharmacy   

## 2022-01-08 DIAGNOSIS — D485 Neoplasm of uncertain behavior of skin: Secondary | ICD-10-CM | POA: Diagnosis not present

## 2022-01-08 DIAGNOSIS — D2339 Other benign neoplasm of skin of other parts of face: Secondary | ICD-10-CM | POA: Diagnosis not present

## 2022-02-04 ENCOUNTER — Telehealth: Payer: Self-pay | Admitting: Family Medicine

## 2022-02-04 NOTE — Telephone Encounter (Signed)
Left message for patient to call back and schedule Medicare Annual Wellness Visit (AWV).   Please offer to do virtually or by telephone.  Left office number and my jabber #[phone].  Last AWV:01/31/2021  Please schedule at anytime with Nurse Health Advisor.   

## 2022-02-28 ENCOUNTER — Ambulatory Visit: Payer: Medicare Other

## 2022-03-05 NOTE — Patient Instructions (Signed)
Cameron Young , Thank you for taking time to come for your Medicare Wellness Visit. I appreciate your ongoing commitment to your health goals. Please review the following plan we discussed and let me know if I can assist you in the future.   Screening recommendations/referrals: Colonoscopy: Done   Recommended yearly ophthalmology/optometry visit for glaucoma screening and checkup Recommended yearly dental visit for hygiene and checkup  Vaccinations: Influenza vaccine: Due Fall 2023. Pneumococcal vaccine: Done 12/02/2018. Tdap vaccine: Due every 10 years.  Shingles vaccine: Discussed.   Covid-19: Declined.  Advanced directives: Advance directive discussed with you today. Even though you declined this today, please call our office should you change your mind, and we can give you the proper paperwork for you to fill out.   Conditions/risks identified: Aim for 30 minutes of exercise or brisk walking, 6-8 glasses of water, and 5 servings of fruits and vegetables each day.  Next appointment: Follow up in one year for your annual wellness visit. 2024  Preventive Care 65 Years and Older, Male  Preventive care refers to lifestyle choices and visits with your health care provider that can promote health and wellness. What does preventive care include? A yearly physical exam. This is also called an annual well check. Dental exams once or twice a year. Routine eye exams. Ask your health care provider how often you should have your eyes checked. Personal lifestyle choices, including: Daily care of your teeth and gums. Regular physical activity. Eating a healthy diet. Avoiding tobacco and drug use. Limiting alcohol use. Practicing safe sex. Taking low doses of aspirin every day. Taking vitamin and mineral supplements as recommended by your health care provider. What happens during an annual well check? The services and screenings done by your health care provider during your annual well check  will depend on your age, overall health, lifestyle risk factors, and family history of disease. Counseling  Your health care provider may ask you questions about your: Alcohol use. Tobacco use. Drug use. Emotional well-being. Home and relationship well-being. Sexual activity. Eating habits. History of falls. Memory and ability to understand (cognition). Work and work Statistician. Screening  You may have the following tests or measurements: Height, weight, and BMI. Blood pressure. Lipid and cholesterol levels. These may be checked every 5 years, or more frequently if you are over 42 years old. Skin check. Lung cancer screening. You may have this screening every year starting at age 31 if you have a 30-pack-year history of smoking and currently smoke or have quit within the past 15 years. Fecal occult blood test (FOBT) of the stool. You may have this test every year starting at age 21. Flexible sigmoidoscopy or colonoscopy. You may have a sigmoidoscopy every 5 years or a colonoscopy every 10 years starting at age 62. Prostate cancer screening. Recommendations will vary depending on your family history and other risks. Hepatitis C blood test. Hepatitis B blood test. Sexually transmitted disease (STD) testing. Diabetes screening. This is done by checking your blood sugar (glucose) after you have not eaten for a while (fasting). You may have this done every 1-3 years. Abdominal aortic aneurysm (AAA) screening. You may need this if you are a current or former smoker. Osteoporosis. You may be screened starting at age 92 if you are at high risk. Talk with your health care provider about your test results, treatment options, and if necessary, the need for more tests. Vaccines  Your health care provider may recommend certain vaccines, such as: Influenza vaccine. This  is recommended every year. Tetanus, diphtheria, and acellular pertussis (Tdap, Td) vaccine. You may need a Td booster every 10  years. Zoster vaccine. You may need this after age 74. Pneumococcal 13-valent conjugate (PCV13) vaccine. One dose is recommended after age 77. Pneumococcal polysaccharide (PPSV23) vaccine. One dose is recommended after age 34. Talk to your health care provider about which screenings and vaccines you need and how often you need them. This information is not intended to replace advice given to you by your health care provider. Make sure you discuss any questions you have with your health care provider. Document Released: 10/26/2015 Document Revised: 06/18/2016 Document Reviewed: 07/31/2015 Elsevier Interactive Patient Education  2017 [place] Prevention in the Home Falls can cause injuries. They can happen to people of all ages. There are many things you can do to make your home safe and to help prevent falls. What can I do on the outside of my home? Regularly fix the edges of walkways and driveways and fix any cracks. Remove anything that might make you trip as you walk through a door, such as a raised step or threshold. Trim any bushes or trees on the path to your home. Use bright outdoor lighting. Clear any walking paths of anything that might make someone trip, such as rocks or tools. Regularly check to see if handrails are loose or broken. Make sure that both sides of any steps have handrails. Any raised decks and porches should have guardrails on the edges. Have any leaves, snow, or ice cleared regularly. Use sand or salt on walking paths during winter. Clean up any spills in your garage right away. This includes oil or grease spills. What can I do in the bathroom? Use night lights. Install grab bars by the toilet and in the tub and shower. Do not use towel bars as grab bars. Use non-skid mats or decals in the tub or shower. If you need to sit down in the shower, use a plastic, non-slip stool. Keep the floor dry. Clean up any water that spills on the floor as soon as it  happens. Remove soap buildup in the tub or shower regularly. Attach bath mats securely with double-sided non-slip rug tape. Do not have throw rugs and other things on the floor that can make you trip. What can I do in the bedroom? Use night lights. Make sure that you have a light by your bed that is easy to reach. Do not use any sheets or blankets that are too big for your bed. They should not hang down onto the floor. Have a firm chair that has side arms. You can use this for support while you get dressed. Do not have throw rugs and other things on the floor that can make you trip. What can I do in the kitchen? Clean up any spills right away. Avoid walking on wet floors. Keep items that you use a lot in easy-to-reach places. If you need to reach something above you, use a strong step stool that has a grab bar. Keep electrical cords out of the way. Do not use floor polish or wax that makes floors slippery. If you must use wax, use non-skid floor wax. Do not have throw rugs and other things on the floor that can make you trip. What can I do with my stairs? Do not leave any items on the stairs. Make sure that there are handrails on both sides of the stairs and use them. Fix handrails that  are broken or loose. Make sure that handrails are as long as the stairways. Check any carpeting to make sure that it is firmly attached to the stairs. Fix any carpet that is loose or worn. Avoid having throw rugs at the top or bottom of the stairs. If you do have throw rugs, attach them to the floor with carpet tape. Make sure that you have a light switch at the top of the stairs and the bottom of the stairs. If you do not have them, ask someone to add them for you. What else can I do to help prevent falls? Wear shoes that: Do not have high heels. Have rubber bottoms. Are comfortable and fit you well. Are closed at the toe. Do not wear sandals. If you use a stepladder: Make sure that it is fully opened.  Do not climb a closed stepladder. Make sure that both sides of the stepladder are locked into place. Ask someone to hold it for you, if possible. Clearly mark and make sure that you can see: Any grab bars or handrails. First and last steps. Where the edge of each step is. Use tools that help you move around (mobility aids) if they are needed. These include: Canes. Walkers. Scooters. Crutches. Turn on the lights when you go into a dark area. Replace any light bulbs as soon as they burn out. Set up your furniture so you have a clear path. Avoid moving your furniture around. If any of your floors are uneven, fix them. If there are any pets around you, be aware of where they are. Review your medicines with your doctor. Some medicines can make you feel dizzy. This can increase your chance of falling. Ask your doctor what other things that you can do to help prevent falls. This information is not intended to replace advice given to you by your health care provider. Make sure you discuss any questions you have with your health care provider. Document Released: 07/26/2009 Document Revised: 03/06/2016 Document Reviewed: 11/03/2014 Elsevier Interactive Patient Education  2017 [employer].

## 2022-03-06 ENCOUNTER — Ambulatory Visit (INDEPENDENT_AMBULATORY_CARE_PROVIDER_SITE_OTHER): Payer: Medicare Other

## 2022-03-06 VITALS — BP 120/68 | HR 72 | Ht 73.0 in | Wt 243.0 lb

## 2022-03-06 DIAGNOSIS — Z0000 Encounter for general adult medical examination without abnormal findings: Secondary | ICD-10-CM

## 2022-03-06 NOTE — Progress Notes (Signed)
Subjective:   Cameron Young is a 76 y.o. male who presents for Medicare Annual/Subsequent preventive examination.  Review of Systems     Cardiac Risk Factors include: diabetes mellitus;dyslipidemia;male gender;advanced age (>40mn, >>51women);obesity (BMI >30kg/m2);sedentary lifestyle;Other (see comment), Risk factor comments: Spinal stenosis     Objective:    Today's Vitals   03/06/22 1144  BP: 120/68  Pulse: 72  SpO2: 99%  Weight: 243 lb (110.2 kg)  Height: '6\' 1"'$  (1.854 m)   Body mass index is 32.06 kg/m.     03/06/2022   11:51 AM 11/21/2021    9:34 AM 01/31/2021   11:33 AM 01/31/2019    8:54 AM 06/02/2014   12:09 PM 05/30/2014    1:10 PM  Advanced Directives  Does Patient Have a Medical Advance Directive? No Yes Yes No No No  Type of Advance Directive  Living [place]     [place]?   No - copy requested     Would patient like information on creating a medical advance directive? No - Patient declined    Yes - EScientist, clinical (histocompatibility and immunogenetics)given Yes - EScientist, clinical (histocompatibility and immunogenetics)given    Current Medications (verified) Outpatient Encounter Medications as of 03/06/2022  Medication Sig   metFORMIN (GLUCOPHAGE) 500 MG tablet Take 2 tablets (1,000 mg total) by mouth 2 (two) times daily with a meal.   pioglitazone (ACTOS) 30 MG tablet Take 1 tablet (30 mg total) by mouth daily.   rosuvastatin (CRESTOR) 10 MG tablet Take 1 tablet (10 mg total) by mouth daily.   No facility-administered encounter medications on file as of 03/06/2022.    Allergies (verified) Patient has no known allergies.   History: Past Medical History:  Diagnosis Date   Arthritis    Borderline diabetic    [place]   [place]   H/O bronchitis 25 years ago   x1   History of kidney stones    x2   Past Surgical History:  Procedure Laterality Date   HEMI-MICRODISCECTOMY LUMBAR LAMINECTOMY LEVEL 1 Right  06/02/2014   Procedure: LUMBAR LAMINECTOMY,CENTRAL  DECOMPRESSION, MICRODISCECTOMY, FORAMINOTOMY  LUMBAR FOUR TO LUMBAR FIVE RIGHT;  Surgeon: RTobi Bastos MD;  Location: [facility];  Service: Orthopedics;  Laterality: Right;   SPINE SURGERY     Family History  Problem Relation Age of Onset   Breast cancer Mother    Cancer Mother    Heart attack Father 950  Diabetes Brother    Social History   Socioeconomic History   Marital status: Married    Spouse name: BClinical cytogeneticist  Number of children: 1   Years of education: Not on file   Highest education level: Not on file  Occupational History   Not on file  Tobacco Use   Smoking status: Never   Smokeless tobacco: Never  Vaping Use   Vaping Use: Never used  Substance and Sexual Activity   Alcohol use: No   Drug use: No   Sexual activity: Not Currently    Birth control/protection: None  Other Topics Concern   Not on file  Social History Narrative   Right handed   [place] since [place].   2 great grandsons.    Social Determinants of Health   Financial Resource Strain: Low Risk    Difficulty of Paying Living Expenses: Not hard at all  Food Insecurity: No Food Insecurity  Worried About Charity fundraiser in the Last Year: Never true   [place] in the Last Year: Never true  Transportation Needs: No Transportation Needs   Lack of Transportation (Medical): No   Lack of Transportation (Non-Medical): No  Physical Activity: Sufficiently Active   Days of Exercise per Week: 5 days   Minutes of Exercise per Session: 30 min  Stress: No Stress Concern Present   Feeling of Stress : Not at all  Social Connections: Socially Integrated   Frequency of Communication with Friends and Family: More than three times a week   Frequency of Social Gatherings with Friends and Family: More than three times a week   Attends Religious Services: More than 4 times per year   Active Member of [employer] or Organizations: Yes    Attends Music therapist: More than 4 times per year   Marital Status: Married    Tobacco Counseling Counseling given: Not Answered   Clinical Intake:  Pre-visit preparation completed: Yes  Pain : No/denies pain     BMI - recorded: 32.06 Nutritional Status: BMI > 30  Obese Nutritional Risks: None Diabetes: Yes  How often do you need to have someone help you when you read instructions, pamphlets, or other written materials from your doctor or pharmacy?: 1 - Never  Diabetic?Nutrition Risk Assessment:  Has the patient had any N/V/D within the last 2 months?  No  Does the patient have any non-healing wounds?  No  Has the patient had any unintentional weight loss or weight gain?  No   Diabetes:  Is the patient diabetic?  Yes  If diabetic, was a CBG obtained today?  No  Did the patient bring in their glucometer from home?  No  How often do you monitor your CBG's? 2-3xpwk.   Financial Strains and Diabetes Management:  Are you having any financial strains with the device, your supplies or your medication? No .  Does the patient want to be seen by Chronic Care Management for management of their diabetes?  No  Would the patient like to be referred to a Nutritionist or for Diabetic Management?  No   Diabetic Exams:  Diabetic Eye Exam: Completed 07/26/2021.  Pt has been advised about the importance in completing this exam.  Diabetic Foot Exam: Completed DUE. Pt has been advised about the importance in completing this exam.   Interpreter Needed?: No  Information entered by :: mj Mumtaz Lovins, lpn   Activities of Daily Living    03/06/2022   11:52 AM  In your present state of health, do you have any difficulty performing the following activities:  Hearing? 0  Vision? 0  Difficulty concentrating or making decisions? 0  Walking or climbing stairs? 0  Dressing or bathing? 0  Doing errands, shopping? 0  Preparing Food and eating ? N  Using the Toilet? N  In the  past six months, have you accidently leaked urine? N  Do you have problems with loss of bowel control? N  Managing your Medications? N  Managing your Finances? N  Housekeeping or managing your Housekeeping? N    Patient Care Team: Susy Frizzle, MD as PCP - General (Family Medicine)  Indicate any recent Medical Services you may have received from other than [facility] providers in the past year (date may be approximate).     Assessment:   This is a routine wellness examination for Bostyn.  Hearing/Vision screen Hearing Screening - Comments:: No hearing issues.  Vision Screening - Comments:: No glasses. 07/26/2021. Dr. Melissa Noon.  Dietary issues and exercise activities discussed: Current Exercise Habits: The patient has a physically strenuous job, but has no regular exercise apart from work., Exercise limited by: cardiac condition(s);orthopedic condition(s)   Goals Addressed             This Visit's Progress    Exercise 3x per week (30 min per time)       Continue to stay active and eat healthy.        Depression Screen    03/06/2022   11:48 AM 01/31/2021   12:54 PM 06/07/2018    8:33 AM 09/01/2016   12:16 PM  PHQ 2/9 Scores  PHQ - 2 Score 0 0 0 0    Fall Risk    03/06/2022   11:51 AM 11/21/2021    9:34 AM 01/31/2021   12:54 PM 06/07/2018    8:33 AM 09/01/2016   12:16 PM  [place] in the past year? 0 0 0 No No  Number falls in past yr: 0 0 0    Injury with Fall? 0 0 0    Risk for fall due to : No Fall Risks      Follow up Falls prevention discussed        FALL RISK PREVENTION PERTAINING TO THE HOME:  Any stairs in or around the home? No  If so, are there any without handrails? No  Home free of loose throw rugs in walkways, pet beds, electrical cords, etc? Yes  Adequate lighting in your home to reduce risk of falls? Yes   ASSISTIVE DEVICES UTILIZED TO PREVENT FALLS:  Life alert? No  Use of a cane, walker or w/c? No  Grab bars in the bathroom?  No  Shower chair or bench in shower? No  Elevated toilet seat or a handicapped toilet? No   TIMED UP AND GO:  Was the test performed? Yes .  Length of time to ambulate 10 feet: 10 sec.   Gait steady and fast without use of assistive device  Cognitive Function:        03/06/2022   11:52 AM 01/31/2021    1:02 PM 01/31/2021   11:31 AM  6CIT Screen  What Year? 0 points 0 points 0 points  What month? 0 points 0 points 0 points  What time? 0 points 0 points 0 points  Count back from 20 0 points 0 points 0 points  Months in reverse 0 points 0 points 0 points  Repeat phrase 2 points 0 points 0 points  Total Score 2 points 0 points 0 points    Immunizations Immunization History  Administered Date(s) Administered   Pneumococcal Polysaccharide-23 12/02/2018    TDAP status: Due, Education has been provided regarding the importance of this vaccine. Advised may receive this vaccine at local pharmacy or Health Dept. Aware to provide a copy of the vaccination record if obtained from local pharmacy or Health Dept. Verbalized acceptance and understanding.  Flu Vaccine status: Declined, Education has been provided regarding the importance of this vaccine but patient still declined. Advised may receive this vaccine at local pharmacy or Health Dept. Aware to provide a copy of the vaccination record if obtained from local pharmacy or Health Dept. Verbalized acceptance and understanding.  Pneumococcal vaccine status: Due, Education has been provided regarding the importance of this vaccine. Advised may receive this vaccine at local pharmacy or Health Dept. Aware to provide a copy of  the vaccination record if obtained from local pharmacy or Health Dept. Verbalized acceptance and understanding.  Covid-19 vaccine status: Declined, Education has been provided regarding the importance of this vaccine but patient still declined. Advised may receive this vaccine at local pharmacy or Health Dept.or vaccine  clinic. Aware to provide a copy of the vaccination record if obtained from local pharmacy or Health Dept. Verbalized acceptance and understanding.  Qualifies for Shingles Vaccine? Yes   Zostavax completed No   Shingrix Completed?: No.    Education has been provided regarding the importance of this vaccine. Patient has been advised to call insurance company to determine out of pocket expense if they have not yet received this vaccine. Advised may also receive vaccine at local pharmacy or Health Dept. Verbalized acceptance and understanding.  Screening Tests Health Maintenance  Topic Date Due   COVID-19 Vaccine (1) 03/22/2022 (Originally 03/05/1947)   Pneumonia Vaccine 29+ Years old (2 - PCV) 10/10/2022 (Originally 12/03/2019)   TETANUS/TDAP  10/10/2022 (Originally 09/05/1964)   Hepatitis C Screening  10/10/2022 (Originally 09/06/1964)   Zoster Vaccines- Shingrix (1 of 2) 10/10/2022 (Originally 09/06/1995)   INFLUENZA VACCINE  05/13/2022   URINE MICROALBUMIN  05/16/2022   HPV VACCINES  Aged Out    Health Maintenance  There are no preventive care reminders to display for this patient.   Colorectal cancer screening: No longer required.   Lung Cancer Screening: (Low Dose CT Chest recommended if Age 41-80 years, 30 pack-year currently smoking OR have quit w/in 15years.) does not qualify.   Additional Screening:  Hepatitis C Screening: does qualify; Completed DUE  Vision Screening: Recommended annual ophthalmology exams for early detection of glaucoma and other disorders of the eye. Is the patient up to date with their annual eye exam?  Yes  Who is the provider or what is the name of the office in which the patient attends annual eye exams? Dr. Melissa Noon If pt is not established with a provider, would they like to be referred to a provider to establish care? No .   Dental Screening: Recommended annual dental exams for proper oral hygiene  Community Resource Referral / Chronic Care  Management: CRR required this visit?  No   CCM required this visit?  No      Plan:     I have personally reviewed and noted the following in the patient's chart:   Medical and social history Use of alcohol, tobacco or illicit drugs  Current medications and supplements including opioid prescriptions. Patient is not currently taking opioid prescriptions. Functional ability and status Nutritional status Physical activity Advanced directives List of other physicians Hospitalizations, surgeries, and ER visits in previous 12 months Vitals Screenings to include cognitive, depression, and falls Referrals and appointments  In addition, I have reviewed and discussed with patient certain preventive protocols, quality metrics, and best practice recommendations. A written personalized care plan for preventive services as well as general preventive health recommendations were provided to patient.     Chriss Driver, LPN   0/27/2536   Nurse Notes: Discussed Shingrix and how to obtain.

## 2022-03-11 ENCOUNTER — Ambulatory Visit (INDEPENDENT_AMBULATORY_CARE_PROVIDER_SITE_OTHER): Payer: Medicare Other | Admitting: Family Medicine

## 2022-03-11 ENCOUNTER — Encounter: Payer: Self-pay | Admitting: Family Medicine

## 2022-03-11 VITALS — BP 130/74 | HR 81 | Ht 73.0 in | Wt 243.0 lb

## 2022-03-11 DIAGNOSIS — L723 Sebaceous cyst: Secondary | ICD-10-CM

## 2022-03-11 NOTE — Progress Notes (Signed)
Subjective:    Patient ID: Cameron Young, male    DOB: December 07, 1945, 76 y.o.   MRN: [id number]  Patient is a very pleasant 76 year old Caucasian gentleman who has a mass growing on the back of his neck.  Has been gradually getting larger for the last 3 weeks.  This approximately 2.5 cm x 3 cm in diameter.  There is a subcutaneous mass that is freely mobile.  It appears to be a sebaceous cyst.  There is no erythema or warmth or tenderness or pain  Past Medical History:  Diagnosis Date   Arthritis    Borderline diabetic    DVT (deep venous thrombosis) ([place]) 10/13/2010   right leg   H/O bronchitis 25 years ago   x1   History of kidney stones    x2   Past Surgical History:  Procedure Laterality Date   HEMI-MICRODISCECTOMY LUMBAR LAMINECTOMY LEVEL 1 Right 06/02/2014   Procedure: LUMBAR LAMINECTOMY,CENTRAL  DECOMPRESSION, MICRODISCECTOMY, FORAMINOTOMY  LUMBAR FOUR TO LUMBAR FIVE RIGHT;  Surgeon: Tobi Bastos, MD;  Location: [facility];  Service: Orthopedics;  Laterality: Right;   SPINE SURGERY     Current Outpatient Medications on File Prior to Visit  Medication Sig Dispense Refill   metFORMIN (GLUCOPHAGE) 500 MG tablet Take 2 tablets (1,000 mg total) by mouth 2 (two) times daily with a meal. 360 tablet 2   pioglitazone (ACTOS) 30 MG tablet Take 1 tablet (30 mg total) by mouth daily. 90 tablet 2   rosuvastatin (CRESTOR) 10 MG tablet Take 1 tablet (10 mg total) by mouth daily. 90 tablet 3   No current facility-administered medications on file prior to visit.   No Known Allergies Social History   Socioeconomic History   Marital status: Married    Spouse name: Becky   Number of children: 1   Years of education: Not on file   Highest education level: Not on file  Occupational History   Not on file  Tobacco Use   Smoking status: Never   Smokeless tobacco: Never  Vaping Use   Vaping Use: Never used  Substance and Sexual Activity   Alcohol use: No   Drug use: No   Sexual  activity: Not Currently    Birth control/protection: None  Other Topics Concern   Not on file  Social History Narrative   Right handed   [place] with wife, [place]. Married since 2008.   2 great grandsons.    Social Determinants of Health   Financial Resource Strain: Low Risk    Difficulty of Paying Living Expenses: Not hard at all  Food Insecurity: No Food Insecurity   Worried About Charity fundraiser in the Last Year: Never true   [place] in the Last Year: Never true  Transportation Needs: No Transportation Needs   Lack of Transportation (Medical): No   Lack of Transportation (Non-Medical): No  Physical Activity: Sufficiently Active   Days of Exercise per Week: 5 days   Minutes of Exercise per Session: 30 min  Stress: No Stress Concern Present   Feeling of Stress : Not at all  Social Connections: Socially Integrated   Frequency of Communication with Friends and Family: More than three times a week   Frequency of Social Gatherings with Friends and Family: More than three times a week   Attends Religious Services: More than 4 times per year   Active Member of [employer] or Organizations: Yes   Attends Archivist Meetings:  More than 4 times per year   Marital Status: Married  Human resources officer Violence: Not At Risk   Fear of Current or Ex-Partner: No   Emotionally Abused: No   Physically Abused: No   Sexually Abused: No     Review of Systems  All other systems reviewed and are negative.     Objective:   Physical Exam Vitals reviewed.  Constitutional:      General: He is not in acute distress.    Appearance: Normal appearance. He is not ill-appearing.  Neck:   Cardiovascular:     Rate and Rhythm: Normal rate and regular rhythm.     Pulses: Normal pulses.     Heart sounds: Normal heart sounds. No murmur heard.   No friction rub. No gallop.  Pulmonary:     Effort: Pulmonary effort is normal. No respiratory distress.     Breath sounds: Normal  breath sounds. No stridor. No wheezing, rhonchi or rales.  Chest:     Chest wall: No tenderness.  Neurological:     Mental Status: He is alert.          Assessment & Plan:  Sebaceous cyst Patient appears to have a sebaceous cyst.  I recommended surgical excision.  He will schedule a surgical appointment to do this for approximately 30 minutes on the date is convenient for him.  At the present time there is no indication for incision and drainage as there is no evidence of inflammation or infection.

## 2022-03-14 ENCOUNTER — Ambulatory Visit (INDEPENDENT_AMBULATORY_CARE_PROVIDER_SITE_OTHER): Payer: Medicare Other | Admitting: Family Medicine

## 2022-03-14 ENCOUNTER — Encounter: Payer: Self-pay | Admitting: Family Medicine

## 2022-03-14 VITALS — BP 128/84 | HR 78 | Ht 73.0 in | Wt 241.0 lb

## 2022-03-14 DIAGNOSIS — L723 Sebaceous cyst: Secondary | ICD-10-CM

## 2022-03-14 NOTE — Progress Notes (Signed)
Subjective:    Patient ID: Cameron Young, male    DOB: 1946-09-22, 76 y.o.   MRN: [id number]  Patient is a very pleasant 76 year old Caucasian gentleman who has a mass growing on the back of his neck.  Has been gradually getting larger for the last 3 weeks.  This approximately 2.5 cm x 3 cm in diameter.  There is a subcutaneous mass that is freely mobile.  It appears to be a sebaceous cyst.  There is no erythema or warmth or tenderness or pain.  He is here today for excision of the cyst.  Past Medical History:  Diagnosis Date   Arthritis    Borderline diabetic    DVT (deep venous thrombosis) ([place]) 10/13/2010   right leg   H/O bronchitis 25 years ago   x1   History of kidney stones    x2   Past Surgical History:  Procedure Laterality Date   HEMI-MICRODISCECTOMY LUMBAR LAMINECTOMY LEVEL 1 Right 06/02/2014   Procedure: LUMBAR LAMINECTOMY,CENTRAL  DECOMPRESSION, MICRODISCECTOMY, FORAMINOTOMY  LUMBAR FOUR TO LUMBAR FIVE RIGHT;  Surgeon: Tobi Bastos, MD;  Location: [facility];  Service: Orthopedics;  Laterality: Right;   SPINE SURGERY     Current Outpatient Medications on File Prior to Visit  Medication Sig Dispense Refill   metFORMIN (GLUCOPHAGE) 500 MG tablet Take 2 tablets (1,000 mg total) by mouth 2 (two) times daily with a meal. 360 tablet 2   pioglitazone (ACTOS) 30 MG tablet Take 1 tablet (30 mg total) by mouth daily. 90 tablet 2   rosuvastatin (CRESTOR) 10 MG tablet Take 1 tablet (10 mg total) by mouth daily. 90 tablet 3   No current facility-administered medications on file prior to visit.   No Known Allergies Social History   Socioeconomic History   Marital status: Married    Spouse name: Becky   Number of children: 1   Years of education: Not on file   Highest education level: Not on file  Occupational History   Not on file  Tobacco Use   Smoking status: Never   Smokeless tobacco: Never  Vaping Use   Vaping Use: Never used  Substance and Sexual Activity    Alcohol use: No   Drug use: No   Sexual activity: Not Currently    Birth control/protection: None  Other Topics Concern   Not on file  Social History Narrative   Right handed   [place] with wife, [place]. Married since 2008.   2 great grandsons.    Social Determinants of Health   Financial Resource Strain: Low Risk    Difficulty of Paying Living Expenses: Not hard at all  Food Insecurity: No Food Insecurity   Worried About Charity fundraiser in the Last Year: Never true   [place] in the Last Year: Never true  Transportation Needs: No Transportation Needs   Lack of Transportation (Medical): No   Lack of Transportation (Non-Medical): No  Physical Activity: Sufficiently Active   Days of Exercise per Week: 5 days   Minutes of Exercise per Session: 30 min  Stress: No Stress Concern Present   Feeling of Stress : Not at all  Social Connections: Socially Integrated   Frequency of Communication with Friends and Family: More than three times a week   Frequency of Social Gatherings with Friends and Family: More than three times a week   Attends Religious Services: More than 4 times per year   Active Member of [employer]  or Organizations: Yes   Attends Music therapist: More than 4 times per year   Marital Status: Married  Human resources officer Violence: Not At Risk   Fear of Current or Ex-Partner: No   Emotionally Abused: No   Physically Abused: No   Sexually Abused: No     Review of Systems  All other systems reviewed and are negative.     Objective:   Physical Exam Vitals reviewed.  Constitutional:      General: He is not in acute distress.    Appearance: Normal appearance. He is not ill-appearing.  Neck:   Cardiovascular:     Rate and Rhythm: Normal rate and regular rhythm.     Pulses: Normal pulses.     Heart sounds: Normal heart sounds. No murmur heard.   No friction rub. No gallop.  Pulmonary:     Effort: Pulmonary effort is normal. No  respiratory distress.     Breath sounds: Normal breath sounds. No stridor. No wheezing, rhonchi or rales.  Chest:     Chest wall: No tenderness.  Neurological:     Mental Status: He is alert.          Assessment & Plan:  Sebaceous cyst Skin was anesthetized with 0.1% lidocaine with epinephrine.  The patient was prepped and draped in sterile fashion.  A 3 cm x 4 cm vertical elliptical excision was performed of the entire cyst sac and its contents.  It was removed in its entirety down to the underlying subcutaneous fascia.  The skin edges were then approximated with 5, simple interrupted 3-0 Ethilon sutures.  Patient tolerated the procedure well without complication.

## 2022-08-19 NOTE — Progress Notes (Unsigned)
Assessment/Plan:   1.  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  Patient does not want DaTscan or skin biopsy.  I think it would be wise if we continue to follow the patient clinically, even if it is just yearly.  He is agreeable to this approach  2.  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  My previous records were reviewed prior to todays visit as well as outside records available to me.  Patient saw me in February and we noted parkinsonian tremor, but he met no other criteria for Parkinsons disease.  He did not want DaTscan or skin biopsy and decided to follow clinically.  Pt denies falls.  Some near falls.  The neuropathy is somewhat bothersome.  Feet and hands with tingling.  He is DM.   Lab Results  Component Value Date   HGBA1C 7.1 (H) 06/18/2021    ALLERGIES:  No Known Allergies  CURRENT MEDICATIONS:  Current Meds  Medication Sig   metFORMIN (GLUCOPHAGE) 500 MG tablet Take 2 tablets (1,000 mg total) by mouth 2 (two) times daily with a meal.   pioglitazone (ACTOS) 30 MG tablet Take 1 tablet (30 mg total) by mouth daily.   rosuvastatin (CRESTOR) 10 MG tablet Take 1 tablet (10 mg total) by mouth daily.     Objective:   PHYSICAL EXAMINATION:    VITALS:   Vitals:   08/21/22 0901  BP: (!) 151/74  Pulse: 76  SpO2: 98%  Weight: 237 lb (107.5 kg)  Height: 6' (1.829 m)    GEN:  The patient appears stated age and is in NAD. HEENT:  Normocephalic, atraumatic.  The mucous membranes are moist. The superficial temporal arteries are without ropiness or tenderness. CV:  RRR Lungs:  CTAB Neck/HEME:  There are no carotid bruits bilaterally.  Neurological examination:  Orientation: The patient is alert and oriented x3. Cranial nerves: There is good  facial symmetry without facial hypomimia. The speech is fluent and clear. Soft palate rises symmetrically and there is no tongue deviation. Hearing is intact to conversational tone. Sensation: Sensation is intact to light touch throughout Motor: Strength is at least antigravity x4.  Movement examination: Tone: There is normal tone in the bilateral UE Abnormal movements: there is intermittent LUE rest tremor, rare.  No postural or intention tremor noted today Coordination:  There is no decremation with RAM's, with any form of RAMS, including alternating supination and pronation of the forearm, hand opening and closing, finger taps, heel taps and toe taps. Gait and Station: The patient has no difficulty arising out of a deep-seated chair without the use of the hands. The patient's stride length is good.  No tremor with ambulation.  No decreased arm swing.  No shuffling.    I have reviewed and interpreted the following labs independently    Chemistry      Component Value Date/Time   NA 138 06/18/2021 0804   K 4.5 06/18/2021 0804   CL 104 06/18/2021 0804   CO2 26 06/18/2021 0804   BUN 19 06/18/2021 0804   CREATININE 1.05 06/18/2021 0804      Component Value Date/Time   CALCIUM 9.3 06/18/2021 0804   ALKPHOS 53 01/31/2019 0851   AST 12 06/18/2021 0804   ALT 14 06/18/2021  0804   BILITOT 0.4 06/18/2021 0804       Lab Results  Component Value Date   WBC 9.2 05/16/2021   HGB 14.1 05/16/2021   HCT 42.3 05/16/2021   MCV 92.0 05/16/2021   PLT 226 05/16/2021    No results found for: "TSH"    Cc:  Susy Frizzle, MD

## 2022-08-21 ENCOUNTER — Ambulatory Visit (INDEPENDENT_AMBULATORY_CARE_PROVIDER_SITE_OTHER): Payer: Medicare Other | Admitting: Neurology

## 2022-08-21 ENCOUNTER — Encounter: Payer: Self-pay | Admitting: Neurology

## 2022-08-21 VITALS — BP 151/74 | HR 76 | Ht 72.0 in | Wt 237.0 lb

## 2022-08-21 DIAGNOSIS — E1142 Type 2 diabetes mellitus with diabetic polyneuropathy: Secondary | ICD-10-CM | POA: Diagnosis not present

## 2022-08-21 DIAGNOSIS — R251 Tremor, unspecified: Secondary | ICD-10-CM | POA: Diagnosis not present

## 2022-08-21 NOTE — Patient Instructions (Signed)
It was good to see you!  The physicians and staff at [facility] are committed to providing excellent care. You may receive a survey requesting feedback about your experience at our office. We strive to receive "very good" responses to the survey questions. If you feel that your experience would prevent you from giving the office a "very good " response, please contact our office to try to remedy the situation. We may be reached at [phone]. Thank you for taking the time out of your busy day to complete the survey.

## 2022-10-21 DIAGNOSIS — K080 Exfoliation of teeth due to systemic causes: Secondary | ICD-10-CM | POA: Diagnosis not present

## 2022-12-19 ENCOUNTER — Ambulatory Visit (INDEPENDENT_AMBULATORY_CARE_PROVIDER_SITE_OTHER): Payer: Medicare Other | Admitting: Family Medicine

## 2022-12-19 ENCOUNTER — Encounter: Payer: Self-pay | Admitting: Family Medicine

## 2022-12-19 VITALS — BP 126/82 | HR 65 | Temp 98.1°F | Ht 72.0 in | Wt 240.0 lb

## 2022-12-19 DIAGNOSIS — L989 Disorder of the skin and subcutaneous tissue, unspecified: Secondary | ICD-10-CM | POA: Diagnosis not present

## 2022-12-19 DIAGNOSIS — G629 Polyneuropathy, unspecified: Secondary | ICD-10-CM | POA: Diagnosis not present

## 2022-12-19 DIAGNOSIS — E118 Type 2 diabetes mellitus with unspecified complications: Secondary | ICD-10-CM

## 2022-12-19 DIAGNOSIS — E161 Other hypoglycemia: Secondary | ICD-10-CM | POA: Diagnosis not present

## 2022-12-19 NOTE — Progress Notes (Signed)
Subjective:    Patient ID: Cameron Young, male    DOB: 01-May-1946, 77 y.o.   MRN: [id number]  Medication Refill  Patient is a 77 year old Caucasian gentleman who is here today for follow-up of his diabetes.  He continues to complain of neuropathy in both feet.  He has no sensation to a 10 g monofilament on the plantar aspects of both feet.  He denies any pain.  He denies any burning or stinging.  He has never had a B12 level checked.  He does have diabetes for which he takes metformin as well as pioglitazone.  He is on these 2 medications due to cost concerns.  He denies any chest pain, shortness of breath, dyspnea on exertion.  He denies any polyuria, polydipsia, or blurry vision.  Of note he has a lesion on the left side of his face just below his and just anterior to his ear.  It is a 1.5 cm x 1 cm hypopigmented spot with scale adhered to it.  The lesion is slightly retracted and is coarse in texture.  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  Past Medical History:  Diagnosis Date   Arthritis    Borderline diabetic    DVT (deep venous thrombosis) ([place]) 10/13/2010   right leg   H/O bronchitis 25 years ago   x1   History of kidney stones    x2   Past Surgical History:  Procedure Laterality Date   HEMI-MICRODISCECTOMY LUMBAR LAMINECTOMY LEVEL 1 Right 06/02/2014   Procedure: LUMBAR LAMINECTOMY,CENTRAL  DECOMPRESSION, MICRODISCECTOMY, FORAMINOTOMY  LUMBAR FOUR TO LUMBAR FIVE RIGHT;  Surgeon: Tobi Bastos, MD;  Location: [facility];  Service: Orthopedics;  Laterality: Right;   SPINE SURGERY     Current Outpatient Medications on File Prior to Visit  Medication Sig Dispense Refill   metFORMIN (GLUCOPHAGE) 500 MG tablet Take 2 tablets (1,000 mg total) by mouth 2 (two) times daily with a meal. 360 tablet 2   pioglitazone (ACTOS) 30 MG tablet Take 1 tablet (30 mg total) by mouth daily. 90 tablet 2   rosuvastatin (CRESTOR) 10 MG tablet Take 1 tablet (10 mg total) by mouth daily. 90 tablet 3   No  current facility-administered medications on file prior to visit.   No Known Allergies Social History   Socioeconomic History   Marital status: Married    Spouse name: Becky   Number of children: 1   Years of education: Not on file   Highest education level: Not on file  Occupational History   Not on file  Tobacco Use   Smoking status: Never   Smokeless tobacco: Never  Vaping Use   Vaping Use: Never used  Substance and Sexual Activity   Alcohol use: No   Drug use: No   Sexual activity: Not Currently    Birth control/protection: None  Other Topics Concern   Not on file  Social History Narrative   Right handed   [place] with wife, [place]. Married since 2008.   2 great grandsons.    Social Determinants of Health   Financial Resource Strain: Low Risk  (03/06/2022)   Overall Financial Resource Strain (CARDIA)    Difficulty of Paying Living Expenses: Not hard at all  Food Insecurity: No Food Insecurity (03/06/2022)   Hunger Vital Sign    Worried About Running Out of Food in the Last Year: Never true    Ran Out of Food in the Last Year: Never true  Transportation Needs: No Transportation  Needs (03/06/2022)   PRAPARE - Hydrologist (Medical): No    Lack of Transportation (Non-Medical): No  Physical Activity: Sufficiently Active (03/06/2022)   Exercise Vital Sign    Days of Exercise per Week: 5 days    Minutes of Exercise per Session: 30 min  Stress: No Stress Concern Present (03/06/2022)   [place]    Feeling of Stress : Not at all  Social Connections: [place] (03/06/2022)   Social Connection and Isolation Panel [NHANES]    Frequency of Communication with Friends and Family: More than three times a week    Frequency of Social Gatherings with Friends and Family: More than three times a week    Attends Religious Services: More than 4 times per year    Active Member  of [employer] or Organizations: Yes    Attends Archivist Meetings: More than 4 times per year    Marital Status: Married  Human resources officer Violence: Not At Risk (03/06/2022)   Humiliation, Afraid, Rape, and Kick questionnaire    Fear of Current or Ex-Partner: No    Emotionally Abused: No    Physically Abused: No    Sexually Abused: No     Review of Systems  All other systems reviewed and are negative.      Objective:   Physical Exam Vitals reviewed.  Constitutional:      General: He is not in acute distress.    Appearance: Normal appearance. He is not ill-appearing.  HENT:     Head:   Cardiovascular:     Rate and Rhythm: Normal rate and regular rhythm.     Pulses: Normal pulses.     Heart sounds: Normal heart sounds. No murmur heard.    No friction rub. No gallop.  Pulmonary:     Effort: Pulmonary effort is normal. No respiratory distress.     Breath sounds: Normal breath sounds. No stridor. No wheezing, rhonchi or rales.  Chest:     Chest wall: No tenderness.  Abdominal:     General: Bowel sounds are normal. There is no distension.     Palpations: Abdomen is soft.     Tenderness: There is no abdominal tenderness. There is no guarding or rebound.  Neurological:     Mental Status: He is alert.           Assessment & Plan:  Neuropathy - Plan: CBC with Differential/Platelet, COMPLETE METABOLIC PANEL WITH GFR, Hemoglobin A1c, Vitamin B12  Skin lesion of face - Plan: Ambulatory referral to Dermatology  Controlled type 2 diabetes mellitus with complication, without long-term current use of insulin ([place]) I believe this lesion needs a biopsy.  Therefore we will consult dermatology given its location.  I am concerned about squamous cell carcinoma.  His blood pressure today is outstanding.  Given his neuropathy I will also check a vitamin B12 level.  Check a hemoglobin A1c along with a CMP.  Goal hemoglobin A1c is less than 6.5.

## 2022-12-20 LAB — COMPLETE METABOLIC PANEL WITH GFR
AG Ratio: 1.5 (calc) (ref 1.0–2.5)
ALT: 17 U/L (ref 9–46)
AST: 14 U/L (ref 10–35)
Albumin: 4.3 g/dL (ref 3.6–5.1)
Alkaline phosphatase (APISO): 54 U/L (ref 35–144)
BUN: 18 mg/dL (ref 7–25)
CO2: 25 mmol/L (ref 20–32)
Calcium: 9.3 mg/dL (ref 8.6–10.3)
Chloride: 103 mmol/L (ref 98–110)
Creat: 1.12 mg/dL (ref 0.70–1.28)
Globulin: 2.8 g/dL (calc) (ref 1.9–3.7)
Glucose, Bld: 167 mg/dL — ABNORMAL HIGH (ref 65–99)
Potassium: 4.6 mmol/L (ref 3.5–5.3)
Sodium: 138 mmol/L (ref 135–146)
Total Bilirubin: 0.4 mg/dL (ref 0.2–1.2)
Total Protein: 7.1 g/dL (ref 6.1–8.1)
eGFR: 68 mL/min/{1.73_m2} (ref >=60)

## 2022-12-20 LAB — HEMOGLOBIN A1C
Hgb A1c MFr Bld: 7.7 % of total Hgb — ABNORMAL HIGH (ref <5.7)
Mean Plasma Glucose: 174 mg/dL
eAG (mmol/L): 9.7 mmol/L

## 2022-12-20 LAB — CBC WITH DIFFERENTIAL/PLATELET
Absolute Monocytes: 511 cells/uL (ref 200–950)
Basophils Absolute: 22 cells/uL (ref 0–200)
Basophils Relative: 0.3 %
Eosinophils Absolute: 173 cells/uL (ref 15–500)
Eosinophils Relative: 2.4 %
HCT: 45.5 % (ref 38.5–50.0)
Hemoglobin: 15 g/dL (ref 13.2–17.1)
Lymphs Abs: 2146 cells/uL (ref 850–3900)
MCH: 29.3 pg (ref 27.0–33.0)
MCHC: 33 g/dL (ref 32.0–36.0)
MCV: 88.9 fL (ref 80.0–100.0)
MPV: 10.7 fL (ref 7.5–12.5)
Monocytes Relative: 7.1 %
Neutro Abs: 4349 cells/uL (ref 1500–7800)
Neutrophils Relative %: 60.4 %
Platelets: 285 10*3/uL (ref 140–400)
RBC: 5.12 10*6/uL (ref 4.20–5.80)
RDW: 12.7 % (ref 11.0–15.0)
Total Lymphocyte: 29.8 %
WBC: 7.2 10*3/uL (ref 3.8–10.8)

## 2022-12-20 LAB — VITAMIN B12: Vitamin B-12: 321 pg/mL (ref 200–1100)

## 2022-12-22 ENCOUNTER — Other Ambulatory Visit: Payer: Self-pay

## 2022-12-22 MED ORDER — VITAMIN B-12 1000 MCG PO TABS: 1000.0000 ug | ORAL_TABLET | Freq: Every day | ORAL | 3 refills | Status: AC

## 2022-12-22 MED ORDER — EMPAGLIFLOZIN 25 MG PO TABS: 25.0000 mg | ORAL_TABLET | Freq: Every day | ORAL | 3 refills | Status: AC

## 2022-12-24 ENCOUNTER — Telehealth: Payer: Self-pay

## 2022-12-24 NOTE — Telephone Encounter (Signed)
Pt's wife, Jacqlyn Larsen, called and states pt is having debilitating headaches daily for the last month. Jacqlyn Larsen states pt forgot to take his Metformin for 3 days and has not had any headaches, since stopping. Jacqlyn Larsen asks if the patient should continue to stay off of the Metformin or could that just be a coincidence? Thank you.

## 2023-01-13 ENCOUNTER — Other Ambulatory Visit: Payer: Self-pay | Admitting: Family Medicine

## 2023-01-13 NOTE — Telephone Encounter (Signed)
Requested medication (s) are due for refill today - expired Rx  Requested medication (s) are on the active medication list -yes  Future visit scheduled -no  Last refill: 01/03/22 #90 2RF  Notes to clinic: expired Rx, last OV 12/19/22  Requested Prescriptions  Pending Prescriptions Disp Refills   pioglitazone (ACTOS) 30 MG tablet [Pharmacy Med Name: PIOGLITAZONE 30 MG TAB[*]] 90 tablet 2    Sig: TAKE ONE TABLET BY MOUTH ONE TIME DAILY     Endocrinology:  Diabetes - Glitazones - pioglitazone Failed - 01/13/2023  2:16 PM      Failed - Valid encounter within last 6 months    Recent Outpatient Visits           10 months ago Sebaceous cyst   [place] Susy Frizzle, MD   1 year ago [place] Susy Frizzle, MD   1 year ago Type 2 diabetes mellitus without complication, without long-term current use of insulin ([place])   [place] Pickard, Cammie Mcgee, MD   1 year ago Medicare annual wellness visit, subsequent   [place] Eulogio Bear, NP   2 years ago Type 2 diabetes mellitus without complication, without long-term current use of insulin ([place])   [place] Pickard, Cammie Mcgee, MD       Future Appointments             In 10 months Brendolyn Patty, MD [place] is between 0 and 7.9 and within 180 days    Hgb A1c MFr Bld  Date Value Ref Range Status  12/19/2022 7.7 (H) <5.7 % of total Hgb Final    Comment:    For someone without known diabetes, a hemoglobin A1c value of 6.5% or greater indicates that they may have  diabetes and this should be confirmed with a follow-up  test. . For someone with known diabetes, a value <7% indicates  that their diabetes is well controlled and a value  greater than or equal to 7% indicates suboptimal  control. A1c targets should be individualized based on  duration of diabetes, age,  comorbid conditions, and  other considerations. . Currently, no consensus exists regarding use of hemoglobin A1c for diagnosis of diabetes for children. .             Requested Prescriptions  Pending Prescriptions Disp Refills   pioglitazone (ACTOS) 30 MG tablet [Pharmacy Med Name: PIOGLITAZONE 30 MG TAB[*]] 90 tablet 2    Sig: TAKE ONE TABLET BY MOUTH ONE TIME DAILY     Endocrinology:  Diabetes - Glitazones - pioglitazone Failed - 01/13/2023  2:16 PM      Failed - Valid encounter within last 6 months    Recent Outpatient Visits           10 months ago Sebaceous cyst   [place] Susy Frizzle, MD   1 year ago Tremor   [place] Susy Frizzle, MD   1 year ago Type 2 diabetes mellitus without complication, without long-term current use of insulin ([place])   [place] Pickard, Cammie Mcgee, MD   1 year ago Medicare annual wellness visit, subsequent   [place], Jessica A, NP   2 years ago Type 2 diabetes mellitus without complication, without long-term current  use of insulin ([place])   [place] Pickard, Cammie Mcgee, MD       Future Appointments             In 10 months Brendolyn Patty, MD [place] is between 0 and 7.9 and within 180 days    Hgb A1c MFr Bld  Date Value Ref Range Status  12/19/2022 7.7 (H) <5.7 % of total Hgb Final    Comment:    For someone without known diabetes, a hemoglobin A1c value of 6.5% or greater indicates that they may have  diabetes and this should be confirmed with a follow-up  test. . For someone with known diabetes, a value <7% indicates  that their diabetes is well controlled and a value  greater than or equal to 7% indicates suboptimal  control. A1c targets should be individualized based on  duration of diabetes, age, comorbid conditions, and  other considerations. . Currently, no  consensus exists regarding use of hemoglobin A1c for diagnosis of diabetes for children. Marland Kitchen

## 2023-01-23 ENCOUNTER — Telehealth: Payer: Self-pay | Admitting: Family Medicine

## 2023-01-23 NOTE — Telephone Encounter (Signed)
Called patient to schedule Medicare Annual Wellness Visit (AWV). Left message for patient to call back and schedule Medicare Annual Wellness Visit (AWV).  Last date of AWV: 03/06/2022   Please schedule an appointment at any time with either Vernona Rieger or [place], [facility]'s. .  If any questions, please contact me at [phone].  Thank you,  Judeth Cornfield,  AMB Clinical Support [facility] AWV Program Direct Dial ??[phone]

## 2023-01-28 ENCOUNTER — Telehealth: Payer: Self-pay

## 2023-01-28 NOTE — Telephone Encounter (Signed)
Pt's wife, Riku Buttery, came by and advised that patient is having headaches daily. Kriste Basque states pt is taking 4-6 Excedrin Migraine pills per day. Kriste Basque is very concerned. Asked if they have a way to check patient's blood pressure and she advised they do not.

## 2023-02-02 ENCOUNTER — Ambulatory Visit (INDEPENDENT_AMBULATORY_CARE_PROVIDER_SITE_OTHER): Payer: Medicare Other | Admitting: Family Medicine

## 2023-02-02 ENCOUNTER — Encounter: Payer: Self-pay | Admitting: Family Medicine

## 2023-02-02 VITALS — BP 132/78 | HR 92 | Temp 97.7°F | Ht 72.0 in | Wt 237.6 lb

## 2023-02-02 DIAGNOSIS — G4452 New daily persistent headache (NDPH): Secondary | ICD-10-CM

## 2023-02-02 MED ORDER — TIZANIDINE HCL 4 MG PO TABS: 4.0000 mg | ORAL_TABLET | Freq: Four times a day (QID) | ORAL | 0 refills | Status: AC | PRN

## 2023-02-02 NOTE — Progress Notes (Signed)
Subjective:    Patient ID: Cameron Young, male    DOB: 1946/08/23, 77 y.o.   MRN: [id number]  Patient has been dealing with a right temporal headache for the last 2 months.  He states that he never had headaches in the past but very seldom had headaches.  He is never been diagnosed with migraines.  More than 2 months ago he started getting a dull constant headache on his right forehead and right temple.  This would occur on a daily basis.  He took Excedrin Migraine and the headache did go away however it would return later in the day usually in the afternoon.  He is getting a headache about every afternoon.  He denies any vision changes.  He denies any pain to palpation over the right temporal artery.  He denies any diplopia.  He denies any memory loss or personality changes.  He denies any strokelike symptoms.  He denies any head trauma.  His neurologic exam is completely normal today.  Cranial nerves II through XII are grossly intact with muscle strength 5/5 and equal and symmetric in the upper and lower extremities.  Romberg testing is normal.  Headache is dull and constant. Past Medical History:  Diagnosis Date   Arthritis    Borderline diabetic    DVT (deep venous thrombosis) 10/13/2010   right leg   H/O bronchitis 25 years ago   x1   History of kidney stones    x2   Past Surgical History:  Procedure Laterality Date   HEMI-MICRODISCECTOMY LUMBAR LAMINECTOMY LEVEL 1 Right 06/02/2014   Procedure: LUMBAR LAMINECTOMY,CENTRAL  DECOMPRESSION, MICRODISCECTOMY, FORAMINOTOMY  LUMBAR FOUR TO LUMBAR FIVE RIGHT;  Surgeon: Jacki Cones, MD;  Location: [facility];  Service: Orthopedics;  Laterality: Right;   SPINE SURGERY     Current Outpatient Medications on File Prior to Visit  Medication Sig Dispense Refill   cyanocobalamin (VITAMIN B12) 1000 MCG tablet Take 1 tablet (1,000 mcg total) by mouth daily. 30 tablet 3   empagliflozin (JARDIANCE) 25 MG TABS tablet Take 1 tablet (25 mg total) by mouth  daily before breakfast. 30 tablet 3   metFORMIN (GLUCOPHAGE) 500 MG tablet Take 2 tablets (1,000 mg total) by mouth 2 (two) times daily with a meal. 360 tablet 2   pioglitazone (ACTOS) 30 MG tablet TAKE ONE TABLET BY MOUTH ONE TIME DAILY 90 tablet 2   rosuvastatin (CRESTOR) 10 MG tablet Take 1 tablet (10 mg total) by mouth daily. 90 tablet 3   No current facility-administered medications on file prior to visit.   No Known Allergies Social History   Socioeconomic History   Marital status: Married    Spouse name: Becky   Number of children: 1   Years of education: Not on file   Highest education level: Not on file  Occupational History   Not on file  Tobacco Use   Smoking status: Never   Smokeless tobacco: Never  Vaping Use   Vaping Use: Never used  Substance and Sexual Activity   Alcohol use: No   Drug use: No   Sexual activity: Not Currently    Birth control/protection: None  Other Topics Concern   Not on file  Social History Narrative   Right handed   [place]   Lives with wife, [place]. Married since 2008.   2 great grandsons.    Social Determinants of Health   Financial Resource Strain: Low Risk  (03/06/2022)   Overall Financial Resource Strain (CARDIA)  Difficulty of Paying Living Expenses: Not hard at all  Food Insecurity: No Food Insecurity (03/06/2022)   Hunger Vital Sign    Worried About Running Out of Food in the Last Year: Never true    Ran Out of Food in the Last Year: Never true  Transportation Needs: No Transportation Needs (03/06/2022)   PRAPARE - Administrator, Civil Service (Medical): No    Lack of Transportation (Non-Medical): No  Physical Activity: Sufficiently Active (03/06/2022)   Exercise Vital Sign    Days of Exercise per Week: 5 days    Minutes of Exercise per Session: 30 min  Stress: No Stress Concern Present (03/06/2022)   [employer] of Occupational Health - Occupational Stress Questionnaire    Feeling of Stress : Not at all   Social Connections: Socially Integrated (03/06/2022)   Social Connection and Isolation Panel [NHANES]    Frequency of Communication with Friends and Family: More than three times a week    Frequency of Social Gatherings with Friends and Family: More than three times a week    Attends Religious Services: More than 4 times per year    Active Member of [employer] or Organizations: Yes    Attends Banker Meetings: More than 4 times per year    Marital Status: Married  Catering manager Violence: Not At Risk (03/06/2022)   Humiliation, Afraid, Rape, and Kick questionnaire    Fear of Current or Ex-Partner: No    Emotionally Abused: No    Physically Abused: No    Sexually Abused: No     Review of Systems  Neurological:  Positive for headaches.  All other systems reviewed and are negative.      Objective:   Physical Exam Vitals reviewed.  Constitutional:      General: He is not in acute distress.    Appearance: Normal appearance. He is not ill-appearing.  HENT:     Head: Normocephalic and atraumatic.   Eyes:     General: No visual field deficit.    Extraocular Movements: Extraocular movements intact.  Cardiovascular:     Rate and Rhythm: Normal rate and regular rhythm.     Pulses: Normal pulses.     Heart sounds: Normal heart sounds. No murmur heard.    No friction rub. No gallop.  Pulmonary:     Effort: Pulmonary effort is normal. No respiratory distress.     Breath sounds: Normal breath sounds. No stridor. No wheezing, rhonchi or rales.  Chest:     Chest wall: No tenderness.  Abdominal:     General: Bowel sounds are normal. There is no distension.     Palpations: Abdomen is soft.     Tenderness: There is no abdominal tenderness. There is no guarding or rebound.  Musculoskeletal:     Cervical back: Normal range of motion.  Neurological:     Mental Status: He is alert and oriented to person, place, and time. Mental status is at baseline.     Cranial Nerves: No  cranial nerve deficit, dysarthria or facial asymmetry.     Sensory: No sensory deficit.     Motor: No weakness.     Coordination: Romberg sign negative. Coordination normal.     Gait: Gait normal.     Deep Tendon Reflexes: Reflexes normal.  Psychiatric:        Mood and Affect: Mood normal.        Speech: Speech normal.        Behavior: Behavior  normal.           Assessment & Plan:  New daily persistent headache I these headaches have been going on every day for 2 months.  I would like to get an MRI of the brain to evaluate for space-occupying lesion in the right frontal lobe.  Neurologic exam today is normal however the patient has no previous history of headaches to suggest an underlying headache disorder such as migraines.  1 possibility would be a chronic tension headache.  The other possibility would be a rebound headache due to overuse of Excedrin migraine.  Therefore I recommended stopping Excedrin Migraine while awaiting the results of the MRI.  He can use a muscle relaxer such as Zanaflex 4 mg every 6 hours as needed for headaches.  If not improving and if the MRI is normal, I would next try Topamax versus gabapentin as a prophylactic medication

## 2023-02-17 DIAGNOSIS — K080 Exfoliation of teeth due to systemic causes: Secondary | ICD-10-CM | POA: Diagnosis not present

## 2023-02-19 DIAGNOSIS — L570 Actinic keratosis: Secondary | ICD-10-CM | POA: Diagnosis not present

## 2023-02-19 DIAGNOSIS — C44319 Basal cell carcinoma of skin of other parts of face: Secondary | ICD-10-CM | POA: Diagnosis not present

## 2023-02-27 ENCOUNTER — Ambulatory Visit
Admission: RE | Admit: 2023-02-27 | Discharge: 2023-02-27 | Disposition: A | Discharge status: Home / Self Care | Payer: Medicare Other | Source: Ambulatory Visit | Attending: Family Medicine | Admitting: Family Medicine

## 2023-02-27 DIAGNOSIS — J321 Chronic frontal sinusitis: Secondary | ICD-10-CM | POA: Diagnosis not present

## 2023-02-27 DIAGNOSIS — G4452 New daily persistent headache (NDPH): Secondary | ICD-10-CM

## 2023-02-27 DIAGNOSIS — R519 Headache, unspecified: Secondary | ICD-10-CM | POA: Diagnosis not present

## 2023-02-27 DIAGNOSIS — I6782 Cerebral ischemia: Secondary | ICD-10-CM | POA: Diagnosis not present

## 2023-02-27 MED ORDER — GADOPICLENOL 0.5 MMOL/ML IV SOLN
10.0000 mL | Freq: Once | INTRAVENOUS | Status: AC | PRN
  Administered 2023-02-27: 10 mL via INTRAVENOUS

## 2023-03-02 ENCOUNTER — Telehealth: Payer: Self-pay

## 2023-03-02 NOTE — Telephone Encounter (Signed)
Call from [facility] at [facility], pt's MRI results are in chart. Impression below:  IMPRESSION: 1. 2 mm focus of asymmetric enhancement within the left internal auditory canal. While this may reflect vascular enhancement, a small vestibular schwannoma cannot be excluded. An internal auditory canal protocol brain MRI (with and without contrast) is recommended for further evaluation. 2. 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 3. Moderate right frontal sinusitis.

## 2023-03-10 ENCOUNTER — Ambulatory Visit: Payer: Medicare Other | Admitting: Family Medicine

## 2023-03-23 DIAGNOSIS — C44319 Basal cell carcinoma of skin of other parts of face: Secondary | ICD-10-CM | POA: Diagnosis not present

## 2023-05-27 NOTE — Progress Notes (Deleted)
Subjective:   Cameron Young is a 77 y.o. male who presents for Medicare Annual/Subsequent preventive examination.  Visit Complete: {VISITMETHOD:[phone]}  Patient Medicare AWV questionnaire was completed by the patient on ***; I have confirmed that all information answered by patient is correct and no changes since this date.  Vital Signs: {telehealth vitals:30100}  Review of Systems    ***       Objective:    There were no vitals filed for this visit. There is no height or weight on file to calculate BMI.     08/21/2022    9:03 AM 03/06/2022   11:51 AM 11/21/2021    9:34 AM 01/31/2021   11:33 AM 01/31/2019    8:54 AM 06/02/2014   12:09 PM 05/30/2014    1:10 PM  Advanced Directives  Does Patient Have a Medical Advance Directive? Yes No Yes Yes No No No  Type of Diplomatic Services operational officer  Living will Healthcare Power of [place];Living will     Copy of Healthcare Power of Attorney in Chart?    No - copy requested     Would patient like information on creating a medical advance directive?  No - Patient declined    Yes - Transport planner given Yes - Transport planner given    Current Medications (verified) Outpatient Encounter Medications as of 05/28/2023  Medication Sig   cyanocobalamin (VITAMIN B12) 1000 MCG tablet Take 1 tablet (1,000 mcg total) by mouth daily.   empagliflozin (JARDIANCE) 25 MG TABS tablet Take 1 tablet (25 mg total) by mouth daily before breakfast.   metFORMIN (GLUCOPHAGE) 500 MG tablet Take 2 tablets (1,000 mg total) by mouth 2 (two) times daily with a meal.   pioglitazone (ACTOS) 30 MG tablet TAKE ONE TABLET BY MOUTH ONE TIME DAILY   rosuvastatin (CRESTOR) 10 MG tablet Take 1 tablet (10 mg total) by mouth daily.   tiZANidine (ZANAFLEX) 4 MG tablet Take 1 tablet (4 mg total) by mouth every 6 (six) hours as needed (headache).   No facility-administered encounter medications on file as of 05/28/2023.    Allergies  (verified) Patient has no known allergies.   History: Past Medical History:  Diagnosis Date   Arthritis    Borderline diabetic    DVT (deep venous thrombosis) (HCC) 10/13/2010   right leg   H/O bronchitis 25 years ago   x1   History of kidney stones    x2   Past Surgical History:  Procedure Laterality Date   HEMI-MICRODISCECTOMY LUMBAR LAMINECTOMY LEVEL 1 Right 06/02/2014   Procedure: LUMBAR LAMINECTOMY,CENTRAL  DECOMPRESSION, MICRODISCECTOMY, FORAMINOTOMY  LUMBAR FOUR TO LUMBAR FIVE RIGHT;  Surgeon: Jacki Cones, MD;  Location: [facility];  Service: Orthopedics;  Laterality: Right;   SPINE SURGERY     Family History  Problem Relation Age of Onset   Breast cancer Mother    Cancer Mother    Heart attack Father 14   Diabetes Brother    Social History   Socioeconomic History   Marital status: Married    Spouse name: Audiological scientist   Number of children: 1   Years of education: Not on file   Highest education level: Not on file  Occupational History   Not on file  Tobacco Use   Smoking status: Never   Smokeless tobacco: Never  Vaping Use   Vaping status: Never Used  Substance and Sexual Activity   Alcohol use: No   Drug use: No   Sexual activity: Not  Currently    Birth control/protection: None  Other Topics Concern   Not on file  Social History Narrative   Right handed   [employer] with wife, Kriste Basque. Married since 2008.   2 great grandsons.    Social Determinants of Health   Financial Resource Strain: Low Risk  (03/06/2022)   Overall Financial Resource Strain (CARDIA)    Difficulty of Paying Living Expenses: Not hard at all  Food Insecurity: No Food Insecurity (03/06/2022)   Hunger Vital Sign    Worried About Running Out of Food in the Last Year: Never true    Ran Out of Food in the Last Year: Never true  Transportation Needs: No Transportation Needs (03/06/2022)   PRAPARE - Administrator, Civil Service (Medical): No    Lack of Transportation  (Non-Medical): No  Physical Activity: Sufficiently Active (03/06/2022)   Exercise Vital Sign    Days of Exercise per Week: 5 days    Minutes of Exercise per Session: 30 min  Stress: No Stress Concern Present (03/06/2022)   [employer] of Occupational Health - Occupational Stress Questionnaire    Feeling of Stress : Not at all  Social Connections: Socially Integrated (03/06/2022)   Social Connection and Isolation Panel [NHANES]    Frequency of Communication with Friends and Family: More than three times a week    Frequency of Social Gatherings with Friends and Family: More than three times a week    Attends Religious Services: More than 4 times per year    Active Member of [employer] or Organizations: Yes    Attends Engineer, structural: More than 4 times per year    Marital Status: Married    Tobacco Counseling Counseling given: Not Answered   Clinical Intake:                        Activities of Daily Living     No data to display          Patient Care Team: Donita Brooks, MD as PCP - General (Family Medicine) Tat, Octaviano Batty, DO as Consulting Physician (Neurology)  Indicate any recent Medical Services you may have received from other than [facility] providers in the past year (date may be approximate).     Assessment:   This is a routine wellness examination for Cameron Young.  Hearing/Vision screen No results found.  Dietary issues and exercise activities discussed:     Goals Addressed   None    Depression Screen    02/02/2023   12:39 PM 12/19/2022    8:55 AM 03/14/2022    3:19 PM 03/11/2022    9:59 AM 03/06/2022   11:48 AM 01/31/2021   12:54 PM 06/07/2018    8:33 AM  PHQ 2/9 Scores  PHQ - 2 Score 0 0 0 0 0 0 0    Fall Risk    02/02/2023   12:39 PM 12/19/2022    8:55 AM 08/21/2022    9:03 AM 03/14/2022    3:18 PM 03/11/2022    9:58 AM  Fall Risk   Falls in the past year? 0 0 0 0 0  Number falls in past yr: 0 0 0    Injury with Fall? 0 0 0     Risk for fall due to : No Fall Risks No Fall Risks     Follow up Falls prevention discussed Falls prevention discussed Falls evaluation completed Falls evaluation completed Falls evaluation  completed    MEDICARE RISK AT HOME:   TIMED UP AND GO:  Was the test performed?  No    Cognitive Function:        03/06/2022   11:52 AM 01/31/2021    1:02 PM 01/31/2021   11:31 AM  6CIT Screen  What Year? 0 points 0 points 0 points  What month? 0 points 0 points 0 points  What time? 0 points 0 points 0 points  Count back from 20 0 points 0 points 0 points  Months in reverse 0 points 0 points 0 points  Repeat phrase 2 points 0 points 0 points  Total Score 2 points 0 points 0 points    Immunizations Immunization History  Administered Date(s) Administered   Pneumococcal Polysaccharide-23 12/02/2018    {TDAP status:2101805}  {Flu Vaccine status:2101806}  {Pneumococcal vaccine status:2101807}  Covid-19 vaccine status: Information provided on how to obtain vaccines.   Qualifies for Shingles Vaccine? Yes   Zostavax completed No   Shingrix Completed?: No.    Education has been provided regarding the importance of this vaccine. Patient has been advised to call insurance company to determine out of pocket expense if they have not yet received this vaccine. Advised may also receive vaccine at local pharmacy or Health Dept. Verbalized acceptance and understanding.  Screening Tests Health Maintenance  Topic Date Due   Diabetic kidney evaluation - Urine ACR  Never done   Zoster Vaccines- Shingrix (1 of 2) Never done   COVID-19 Vaccine (1 - 2023-24 season) Never done   Medicare Annual Wellness (AWV)  03/07/2023   INFLUENZA VACCINE  05/14/2023   Pneumonia Vaccine 26+ Years old (2 of 2 - PCV) 12/19/2023 (Originally 12/03/2019)   Hepatitis C Screening  12/19/2023 (Originally 09/06/1964)   Diabetic kidney evaluation - eGFR measurement  12/19/2023   HPV VACCINES  Aged Out   DTaP/Tdap/Td   Discontinued    Health Maintenance  Health Maintenance Due  Topic Date Due   Diabetic kidney evaluation - Urine ACR  Never done   Zoster Vaccines- Shingrix (1 of 2) Never done   COVID-19 Vaccine (1 - 2023-24 season) Never done   Medicare Annual Wellness (AWV)  03/07/2023   INFLUENZA VACCINE  05/14/2023    Colorectal cancer screening: No longer required.   Lung Cancer Screening: (Low Dose CT Chest recommended if Age 8-80 years, 20 pack-year currently smoking OR have quit w/in 15years.) does not qualify.   Lung Cancer Screening Referral: n/a  Additional Screening:  Hepatitis C Screening: does qualify  Vision Screening: Recommended annual ophthalmology exams for early detection of glaucoma and other disorders of the eye. Is the patient up to date with their annual eye exam?  Yes  Who is the provider or what is the name of the office in which the patient attends annual eye exams? Dr. Emily Filbert If pt is not established with a provider, would they like to be referred to a provider to establish care? No .   Dental Screening: Recommended annual dental exams for proper oral hygiene  Community Resource Referral / Chronic Care Management: CRR required this visit?  No   CCM required this visit?  No     Plan:     I have personally reviewed and noted the following in the patient's chart:   Medical and social history Use of alcohol, tobacco or illicit drugs  Current medications and supplements including opioid prescriptions. {Opioid [phone]} Functional ability and status Nutritional status Physical activity Advanced directives List of  other physicians Hospitalizations, surgeries, and ER visits in previous 12 months Vitals Screenings to include cognitive, depression, and falls Referrals and appointments  In addition, I have reviewed and discussed with patient certain preventive protocols, quality metrics, and best practice recommendations. A written personalized  care plan for preventive services as well as general preventive health recommendations were provided to patient.     Kandis Fantasia [place], [place]   02/03/5360   After Visit Summary: {CHL AMB AWV After Visit [phone]}  Nurse Notes: ***

## 2023-08-21 NOTE — Progress Notes (Unsigned)
Assessment/Plan:   1.  Parkinsonian tremor  -have not seen anything further develop.  He doesn't want DaT/skin biopsy.  Discussed s/s for which I would want him to f/u  2.  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  Abnormal brain scan  -Patient previously with brain scan not ordered by me that showed a 2 mm focus of asymmetric enhancement within the left internal auditory canal.  It was unclear if this was just vascular enhancement or a small vestibular schwannoma.  Radiology recommended follow-up with internal auditory canal protocol.  I do not see that that was ever completed. We will schedule  Subjective:   Cameron Young was seen today in follow up for .  My previous records were reviewed prior to todays visit as well as outside records available to me.  We have been following the patient yearly for parkinsonian tremor, without evidence of Parkinsons disease.  Patient feels that that has been stable.  He has had no falls.  No lightheadedness or near syncope.  Back in the early spring, the patient was apparently having headaches, according to his chart.  MRI brain was done and this was noted to show a 2 mm focus of asymmetric enhancement within the left internal auditory canal.  This is felt to either represent a simple vascular enhancement or a small vestibular schwannoma.  Internal auditory canal protocol brain MRI was recommended for follow-up.  I do not see that that was ever done.  He is free of HA now   Lab Results  Component Value Date   HGBA1C 7.7 (H) 12/19/2022    ALLERGIES:  No Known Allergies  CURRENT MEDICATIONS:  Current Meds  Medication Sig   cyanocobalamin (VITAMIN B12) 1000 MCG tablet Take 1 tablet (1,000 mcg total) by mouth daily.   empagliflozin (JARDIANCE) 25 MG TABS tablet Take 1 tablet (25 mg total) by mouth daily before  breakfast.   metFORMIN (GLUCOPHAGE) 500 MG tablet Take 2 tablets (1,000 mg total) by mouth 2 (two) times daily with a meal.   pioglitazone (ACTOS) 30 MG tablet TAKE ONE TABLET BY MOUTH ONE TIME DAILY   rosuvastatin (CRESTOR) 10 MG tablet Take 1 tablet (10 mg total) by mouth daily.   tiZANidine (ZANAFLEX) 4 MG tablet Take 1 tablet (4 mg total) by mouth every 6 (six) hours as needed (headache).     Objective:   PHYSICAL EXAMINATION:    VITALS:   Vitals:   08/25/23 0903  BP: 122/80  Pulse: 71  SpO2: 98%  Weight: 239 lb 3.2 oz (108.5 kg)  Height: 6\' 1"  (1.854 m)   GEN:  The patient appears stated age and is in NAD. HEENT:  Normocephalic, atraumatic.  The mucous membranes are moist. The superficial temporal arteries are without ropiness or tenderness.   Neurological examination:  Orientation: The patient is alert and oriented x3. Cranial nerves: There is good facial symmetry without facial hypomimia. The speech is fluent and clear. Soft palate rises symmetrically and there is no tongue deviation. Hearing is intact to conversational tone. Sensation: Sensation is intact to light touch throughout Motor: Strength is at least antigravity x4.  Movement examination: Tone: There is normal tone in the bilateral UE Abnormal movements: no tremor seen today Coordination:  There is no decremation with RAM's, with any form of RAMS, including alternating supination and pronation of  the forearm, hand opening and closing, finger taps, heel taps and toe taps. Gait and Station: The patient has no difficulty arising out of a deep-seated chair without the use of the hands. The patient's stride length is good.  No tremor with ambulation.  No decreased arm swing.  No shuffling.    I have reviewed and interpreted the following labs independently    Chemistry      Component Value Date/Time   NA 138 12/19/2022 0915   K 4.6 12/19/2022 0915   CL 103 12/19/2022 0915   CO2 25 12/19/2022 0915   BUN 18  12/19/2022 0915   CREATININE 1.12 12/19/2022 0915      Component Value Date/Time   CALCIUM 9.3 12/19/2022 0915   ALKPHOS 53 01/31/2019 0851   AST 14 12/19/2022 0915   ALT 17 12/19/2022 0915   BILITOT 0.4 12/19/2022 0915       Lab Results  Component Value Date   WBC 7.2 12/19/2022   HGB 15.0 12/19/2022   HCT 45.5 12/19/2022   MCV 88.9 12/19/2022   PLT 285 12/19/2022    No results found for: "TSH"   Cc:  Donita Brooks, MD

## 2023-08-25 ENCOUNTER — Other Ambulatory Visit: Payer: Self-pay

## 2023-08-25 ENCOUNTER — Encounter: Payer: Self-pay | Admitting: Neurology

## 2023-08-25 ENCOUNTER — Ambulatory Visit: Payer: Medicare Other | Admitting: Neurology

## 2023-08-25 VITALS — BP 122/80 | HR 71 | Ht 73.0 in | Wt 239.2 lb

## 2023-08-25 DIAGNOSIS — R251 Tremor, unspecified: Secondary | ICD-10-CM

## 2023-08-25 DIAGNOSIS — R9402 Abnormal brain scan: Secondary | ICD-10-CM | POA: Diagnosis not present

## 2023-08-25 NOTE — Patient Instructions (Signed)
A referral to [facility] has been placed for your MRI someone will contact you directly to schedule your appt. They are located at [address] Please contact them directly by calling [phone] with any questions regarding your referral.  

## 2023-09-04 ENCOUNTER — Encounter: Payer: Self-pay | Admitting: Neurology

## 2023-09-16 ENCOUNTER — Ambulatory Visit
Admission: RE | Admit: 2023-09-16 | Discharge: 2023-09-16 | Disposition: A | Discharge status: Home / Self Care | Payer: Medicare Other | Source: Ambulatory Visit | Attending: Neurology | Admitting: Neurology

## 2023-09-16 DIAGNOSIS — R930 Abnormal findings on diagnostic imaging of skull and head, not elsewhere classified: Secondary | ICD-10-CM | POA: Diagnosis not present

## 2023-09-16 DIAGNOSIS — R519 Headache, unspecified: Secondary | ICD-10-CM | POA: Diagnosis not present

## 2023-09-16 DIAGNOSIS — R9402 Abnormal brain scan: Secondary | ICD-10-CM

## 2023-09-16 MED ORDER — GADOPICLENOL 0.5 MMOL/ML IV SOLN
10.0000 mL | Freq: Once | INTRAVENOUS | Status: AC | PRN
  Administered 2023-09-16: 10 mL via INTRAVENOUS

## 2023-09-22 ENCOUNTER — Other Ambulatory Visit: Payer: Self-pay | Admitting: Family Medicine

## 2023-09-22 ENCOUNTER — Other Ambulatory Visit: Payer: Self-pay

## 2023-09-22 DIAGNOSIS — E118 Type 2 diabetes mellitus with unspecified complications: Secondary | ICD-10-CM

## 2023-09-22 MED ORDER — METFORMIN HCL 500 MG PO TABS: 1000.0000 mg | ORAL_TABLET | Freq: Two times a day (BID) | ORAL | 1 refills | Status: DC

## 2023-09-22 MED ORDER — PIOGLITAZONE HCL 30 MG PO TABS: 30.0000 mg | ORAL_TABLET | Freq: Every day | ORAL | 0 refills | Status: DC

## 2023-09-24 DIAGNOSIS — K080 Exfoliation of teeth due to systemic causes: Secondary | ICD-10-CM | POA: Diagnosis not present

## 2023-11-19 ENCOUNTER — Ambulatory Visit: Payer: Medicare Other | Admitting: *Deleted

## 2023-11-19 DIAGNOSIS — Z0000 Encounter for general adult medical examination without abnormal findings: Secondary | ICD-10-CM

## 2023-11-19 NOTE — Progress Notes (Signed)
 Subjective:   Cameron Young is a 78 y.o. male who presents for Medicare Annual/Subsequent preventive examination.  Visit Complete: Virtual I connected with  Cameron Young on 11/19/23 by a audio enabled telemedicine application and verified that I am speaking with the correct person using two identifiers.  Patient Location: Home  Provider Location: Home Office  I discussed the limitations of evaluation and management by telemedicine. The patient expressed understanding and agreed to proceed.  Vital Signs: Because this visit was a virtual/telehealth visit, some criteria may be missing or patient reported. Any vitals not documented were not able to be obtained and vitals that have been documented are patient reported.  Patient Medicare AWV questionnaire was completed by the patient on 10-22-2023; I have confirmed that all information answered by patient is correct and no changes since this date.  Cardiac Risk Factors include: advanced age (>32men, >68 women);hypertension;male gender     Objective:    There were no vitals filed for this visit. There is no height or weight on file to calculate BMI.     11/19/2023    2:41 PM 08/25/2023    9:04 AM 08/21/2022    9:03 AM 03/06/2022   11:51 AM 11/21/2021    9:34 AM 01/31/2021   11:33 AM 01/31/2019    8:54 AM  Advanced Directives  Does Patient Have a Medical Advance Directive? No Yes Yes No Yes Yes No  Type of Surveyor, Minerals  Living will Healthcare Power of [place];Living will   Copy of Healthcare Power of Attorney in Chart?      No - copy requested   Would patient like information on creating a medical advance directive? No - Patient declined   No - Patient declined       Current Medications (verified) Outpatient Encounter Medications as of 11/19/2023  Medication Sig   cyanocobalamin  (VITAMIN B12) 1000 MCG tablet Take 1 tablet (1,000 mcg total) by mouth daily.   empagliflozin  (JARDIANCE ) 25 MG TABS tablet  Take 1 tablet (25 mg total) by mouth daily before breakfast.   metFORMIN  (GLUCOPHAGE ) 500 MG tablet Take 2 tablets (1,000 mg total) by mouth 2 (two) times daily with a meal.   pioglitazone  (ACTOS ) 30 MG tablet Take 1 tablet (30 mg total) by mouth daily.   rosuvastatin  (CRESTOR ) 10 MG tablet Take 1 tablet (10 mg total) by mouth daily.   tiZANidine  (ZANAFLEX ) 4 MG tablet Take 1 tablet (4 mg total) by mouth every 6 (six) hours as needed (headache).   No facility-administered encounter medications on file as of 11/19/2023.    Allergies (verified) Patient has no known allergies.   History: Past Medical History:  Diagnosis Date   Arthritis    Borderline diabetic    DVT (deep venous thrombosis) (HCC) 10/13/2010   right leg   H/O bronchitis 25 years ago   x1   History of kidney stones    x2   Osteoporosis    Past Surgical History:  Procedure Laterality Date   HEMI-MICRODISCECTOMY LUMBAR LAMINECTOMY LEVEL 1 Right 06/02/2014   Procedure: LUMBAR LAMINECTOMY,CENTRAL  DECOMPRESSION, MICRODISCECTOMY, FORAMINOTOMY  LUMBAR FOUR TO LUMBAR FIVE RIGHT;  Surgeon: Tanda DELENA Heading, MD;  Location: [facility];  Service: Orthopedics;  Laterality: Right;   SPINE SURGERY     Family History  Problem Relation Age of Onset   Breast cancer Mother    Cancer Mother    Heart attack Father 49   Diabetes Brother  Social History   Socioeconomic History   Marital status: Married    Spouse name: Cameron Young   Number of children: 1   Years of education: Not on file   Highest education level: Not on file  Occupational History   Not on file  Tobacco Use   Smoking status: Never   Smokeless tobacco: Never  Vaping Use   Vaping status: Never Used  Substance and Sexual Activity   Alcohol use: No   Drug use: No   Sexual activity: Not Currently    Birth control/protection: None  Other Topics Concern   Not on file  Social History Narrative   Right handed   [place]   Lives with wife, Cameron Young. Married since 2008.    2 great grandsons.    Social Drivers of Corporate Investment Banker Strain: Low Risk  (11/19/2023)   Overall Financial Resource Strain (CARDIA)    Difficulty of Paying Living Expenses: Not hard at all  Food Insecurity: No Food Insecurity (11/19/2023)   Hunger Vital Sign    Worried About Running Out of Food in the Last Year: Never true    Ran Out of Food in the Last Year: Never true  Transportation Needs: No Transportation Needs (11/19/2023)   PRAPARE - Administrator, Civil Service (Medical): No    Lack of Transportation (Non-Medical): No  Physical Activity: Sufficiently Active (11/19/2023)   Exercise Vital Sign    Days of Exercise per Week: 5 days    Minutes of Exercise per Session: 30 min  Stress: No Stress Concern Present (11/19/2023)   [employer] of Occupational Health - Occupational Stress Questionnaire    Feeling of Stress : Not at all  Social Connections: Socially Integrated (11/19/2023)   Social Connection and Isolation Panel [NHANES]    Frequency of Communication with Friends and Family: More than three times a week    Frequency of Social Gatherings with Friends and Family: More than three times a week    Attends Religious Services: More than 4 times per year    Active Member of [employer] or Organizations: Yes    Attends Engineer, Structural: More than 4 times per year    Marital Status: Married    Tobacco Counseling Counseling given: Not Answered   Clinical Intake:  Pre-visit preparation completed: Yes  Pain : No/denies pain     Diabetes: No  How often do you need to have someone help you when you read instructions, pamphlets, or other written materials from your doctor or pharmacy?: 1 - Never  Interpreter Needed?: No  Information entered by :: Mliss Graff LPN   Activities of Daily Living    11/19/2023    2:42 PM 10/22/2023    7:03 PM  In your present state of health, do you have any difficulty performing the following activities:  Hearing?  0 0  Vision? 0 0  Difficulty concentrating or making decisions? 0 0  Walking or climbing stairs? 0 0  Dressing or bathing? 0 0  Doing errands, shopping? 0 0  Preparing Food and eating ? N N  Using the Toilet? N N  In the past six months, have you accidently leaked urine? N N  Do you have problems with loss of bowel control? N N  Managing your Medications? N N  Managing your Finances? N N  Housekeeping or managing your Housekeeping? N N    Patient Care Team: Duanne Butler DASEN, MD as PCP - General (Family Medicine) Tat,  Asberry RAMAN, DO as Consulting Physician (Neurology)  Indicate any recent Medical Services you may have received from other than [facility] providers in the past year (date may be approximate).     Assessment:   This is a routine wellness examination for Cameron Young.  Hearing/Vision screen Hearing Screening - Comments:: No trouble hearing Vision Screening - Comments:: Up to date Unsure of name   Goals Addressed             This Visit's Progress    Patient Stated       Maintain current lifestyle       Depression Screen    11/19/2023    2:43 PM 02/02/2023   12:39 PM 12/19/2022    8:55 AM 03/14/2022    3:19 PM 03/11/2022    9:59 AM 03/06/2022   11:48 AM 01/31/2021   12:54 PM  PHQ 2/9 Scores  PHQ - 2 Score 0 0 0 0 0 0 0  PHQ- 9 Score 0          Fall Risk    11/19/2023    2:42 PM 10/22/2023    7:03 PM 08/25/2023    9:03 AM 02/02/2023   12:39 PM 12/19/2022    8:55 AM  Fall Risk   Falls in the past year? 1 1 0 0 0  Number falls in past yr: 0 0 0 0 0  Injury with Fall? 0 0 0 0 0  Risk for fall due to :    No Fall Risks No Fall Risks  Follow up Falls evaluation completed;Education provided;Falls prevention discussed  Falls evaluation completed Falls prevention discussed Falls prevention discussed    MEDICARE RISK AT HOME: Medicare Risk at Home Any stairs in or around the home?: Yes If so, are there any without handrails?: No Home free of loose throw rugs in walkways,  pet beds, electrical cords, etc?: Yes Adequate lighting in your home to reduce risk of falls?: Yes Life alert?: No Use of a cane, walker or w/c?: No Grab bars in the bathroom?: No Shower chair or bench in shower?: No Elevated toilet seat or a handicapped toilet?: Yes  TIMED UP AND GO:  Was the test performed?  No    Cognitive Function:        11/19/2023    2:41 PM 03/06/2022   11:52 AM 01/31/2021    1:02 PM 01/31/2021   11:31 AM  6CIT Screen  What Year? 0 points 0 points 0 points 0 points  What month? 0 points 0 points 0 points 0 points  What time? 0 points 0 points 0 points 0 points  Count back from 20 0 points 0 points 0 points 0 points  Months in reverse 0 points 0 points 0 points 0 points  Repeat phrase 0 points 2 points 0 points 0 points  Total Score 0 points 2 points 0 points 0 points    Immunizations Immunization History  Administered Date(s) Administered   Pneumococcal Polysaccharide-23 12/02/2018    TDAP status: Due, Education has been provided regarding the importance of this vaccine. Advised may receive this vaccine at local pharmacy or Health Dept. Aware to provide a copy of the vaccination record if obtained from local pharmacy or Health Dept. Verbalized acceptance and understanding.  Flu Vaccine status: Declined, Education has been provided regarding the importance of this vaccine but patient still declined. Advised may receive this vaccine at local pharmacy or Health Dept. Aware to provide a copy of the vaccination record if obtained  from local pharmacy or Health Dept. Verbalized acceptance and understanding.  Pneumococcal vaccine status: Due, Education has been provided regarding the importance of this vaccine. Advised may receive this vaccine at local pharmacy or Health Dept. Aware to provide a copy of the vaccination record if obtained from local pharmacy or Health Dept. Verbalized acceptance and understanding.  Covid-19 vaccine status: Declined, Education has  been provided regarding the importance of this vaccine but patient still declined. Advised may receive this vaccine at local pharmacy or Health Dept.or vaccine clinic. Aware to provide a copy of the vaccination record if obtained from local pharmacy or Health Dept. Verbalized acceptance and understanding.  Qualifies for Shingles Vaccine? Yes   Zostavax completed No   Shingrix Completed?: No.    Education has been provided regarding the importance of this vaccine. Patient has been advised to call insurance company to determine out of pocket expense if they have not yet received this vaccine. Advised may also receive vaccine at local pharmacy or Health Dept. Verbalized acceptance and understanding.  Screening Tests Health Maintenance  Topic Date Due   Diabetic kidney evaluation - Urine ACR  Never done   Zoster Vaccines- Shingrix (1 of 2) Never done   COVID-19 Vaccine (1 - 2024-25 season) Never done   Pneumonia Vaccine 86+ Years old (2 of 2 - PCV) 12/19/2023 (Originally 12/03/2019)   Hepatitis C Screening  12/19/2023 (Originally 09/06/1964)   INFLUENZA VACCINE  01/11/2024 (Originally 05/14/2023)   Diabetic kidney evaluation - eGFR measurement  12/19/2023   Medicare Annual Wellness (AWV)  11/18/2024   HPV VACCINES  Aged Out   DTaP/Tdap/Td  Discontinued    Health Maintenance  Health Maintenance Due  Topic Date Due   Diabetic kidney evaluation - Urine ACR  Never done   Zoster Vaccines- Shingrix (1 of 2) Never done   COVID-19 Vaccine (1 - 2024-25 season) Never done    Colorectal cancer screening: No longer required.   Lung Cancer Screening: (Low Dose CT Chest recommended if Age 9-80 years, 20 pack-year currently smoking OR have quit w/in 15years.) does not qualify.   Lung Cancer Screening Referral:   Additional Screening:  Hepatitis C Screening  never done  Vision Screening: Recommended annual ophthalmology exams for early detection of glaucoma and other disorders of the eye. Is the  patient up to date with their annual eye exam?  Yes  Who is the provider or what is the name of the office in which the patient attends annual eye exams? Unsure of name If pt is not established with a provider, would they like to be referred to a provider to establish care? No .   Dental Screening: Recommended annual dental exams for proper oral hygiene    Community Resource Referral / Chronic Care Management: CRR required this visit?  No   CCM required this visit?  No     Plan:     I have personally reviewed and noted the following in the patient's chart:   Medical and social history Use of alcohol, tobacco or illicit drugs  Current medications and supplements including opioid prescriptions. Patient is not currently taking opioid prescriptions. Functional ability and status Nutritional status Physical activity Advanced directives List of other physicians Hospitalizations, surgeries, and ER visits in previous 12 months Vitals Screenings to include cognitive, depression, and falls Referrals and appointments  In addition, I have reviewed and discussed with patient certain preventive protocols, quality metrics, and best practice recommendations. A written personalized care plan for preventive services as  well as general preventive health recommendations were provided to patient.     Mliss Graff, LPN   04/14/7973   After Visit Summary: (MyChart) Due to this being a telephonic visit, the after visit summary with patients personalized plan was offered to patient via MyChart   Nurse Notes:

## 2023-11-19 NOTE — Patient Instructions (Signed)
 [name] , Thank you for taking time to come for your Medicare Wellness Visit. I appreciate your ongoing commitment to your health goals. Please review the following plan we discussed and let me know if I can assist you in the future.   Screening recommendations/referrals: Colonoscopy: no longer required Recommended yearly ophthalmology/optometry visit for glaucoma screening and checkup Recommended yearly dental visit for hygiene and checkup  Vaccinations: Influenza vaccine: Education provided Pneumococcal vaccine: Education provided Tdap vaccine: Education provided Shingles vaccine: Education provided        Preventive Care 65 Years and Older, Male Preventive care refers to lifestyle choices and visits with your health care provider that can promote health and wellness. What does preventive care include? A yearly physical exam. This is also called an annual well check. Dental exams once or twice a year. Routine eye exams. Ask your health care provider how often you should have your eyes checked. Personal lifestyle choices, including: Daily care of your teeth and gums. Regular physical activity. Eating a healthy diet. Avoiding tobacco and drug use. Limiting alcohol use. Practicing safe sex. Taking low doses of aspirin  every day. Taking vitamin and mineral supplements as recommended by your health care provider. What happens during an annual well check? The services and screenings done by your health care provider during your annual well check will depend on your age, overall health, lifestyle risk factors, and family history of disease. Counseling  Your health care provider may ask you questions about your: Alcohol use. Tobacco use. Drug use. Emotional well-being. Home and relationship well-being. Sexual activity. Eating habits. History of falls. Memory and ability to understand (cognition). Work and work astronomer. Screening  You may have the following tests or  measurements: Height, weight, and BMI. Blood pressure. Lipid and cholesterol levels. These may be checked every 5 years, or more frequently if you are over 62 years old. Skin check. Lung cancer screening. You may have this screening every year starting at age 69 if you have a 30-pack-year history of smoking and currently smoke or have quit within the past 15 years. Fecal occult blood test (FOBT) of the stool. You may have this test every year starting at age 63. Flexible sigmoidoscopy or colonoscopy. You may have a sigmoidoscopy every 5 years or a colonoscopy every 10 years starting at age 55. Prostate cancer screening. Recommendations will vary depending on your family history and other risks. Hepatitis C blood test. Hepatitis B blood test. Sexually transmitted disease (STD) testing. Diabetes screening. This is done by checking your blood sugar (glucose) after you have not eaten for a while (fasting). You may have this done every 1-3 years. Abdominal aortic aneurysm (AAA) screening. You may need this if you are a current or former smoker. Osteoporosis. You may be screened starting at age 48 if you are at high risk. Talk with your health care provider about your test results, treatment options, and if necessary, the need for more tests. Vaccines  Your health care provider may recommend certain vaccines, such as: Influenza vaccine. This is recommended every year. Tetanus, diphtheria, and acellular pertussis (Tdap, Td) vaccine. You may need a Td booster every 10 years. Zoster vaccine. You may need this after age 60. Pneumococcal 13-valent conjugate (PCV13) vaccine. One dose is recommended after age 30. Pneumococcal polysaccharide (PPSV23) vaccine. One dose is recommended after age 63. Talk to your health care provider about which screenings and vaccines you need and how often you need them. This information is not intended to replace  advice given to you by your health care provider. Make sure  you discuss any questions you have with your health care provider. Document Released: 10/26/2015 Document Revised: 06/18/2016 Document Reviewed: 07/31/2015 Elsevier Interactive Patient Education  2017 [employer].  Fall Prevention in the Home Falls can cause injuries. They can happen to people of all ages. There are many things you can do to make your home safe and to help prevent falls. What can I do on the outside of my home? Regularly fix the edges of walkways and driveways and fix any cracks. Remove anything that might make you trip as you walk through a door, such as a raised step or threshold. Trim any bushes or trees on the path to your home. Use bright outdoor lighting. Clear any walking paths of anything that might make someone trip, such as rocks or tools. Regularly check to see if handrails are loose or broken. Make sure that both sides of any steps have handrails. Any raised decks and porches should have guardrails on the edges. Have any leaves, snow, or ice cleared regularly. Use sand or salt on walking paths during winter. Clean up any spills in your garage right away. This includes oil or grease spills. What can I do in the bathroom? Use night lights. Install grab bars by the toilet and in the tub and shower. Do not use towel bars as grab bars. Use non-skid mats or decals in the tub or shower. If you need to sit down in the shower, use a plastic, non-slip stool. Keep the floor dry. Clean up any water that spills on the floor as soon as it happens. Remove soap buildup in the tub or shower regularly. Attach bath mats securely with double-sided non-slip rug tape. Do not have throw rugs and other things on the floor that can make you trip. What can I do in the bedroom? Use night lights. Make sure that you have a light by your bed that is easy to reach. Do not use any sheets or blankets that are too big for your bed. They should not hang down onto the floor. Have a firm  chair that has side arms. You can use this for support while you get dressed. Do not have throw rugs and other things on the floor that can make you trip. What can I do in the kitchen? Clean up any spills right away. Avoid walking on wet floors. Keep items that you use a lot in easy-to-reach places. If you need to reach something above you, use a strong step stool that has a grab bar. Keep electrical cords out of the way. Do not use floor polish or wax that makes floors slippery. If you must use wax, use non-skid floor wax. Do not have throw rugs and other things on the floor that can make you trip. What can I do with my stairs? Do not leave any items on the stairs. Make sure that there are handrails on both sides of the stairs and use them. Fix handrails that are broken or loose. Make sure that handrails are as long as the stairways. Check any carpeting to make sure that it is firmly attached to the stairs. Fix any carpet that is loose or worn. Avoid having throw rugs at the top or bottom of the stairs. If you do have throw rugs, attach them to the floor with carpet tape. Make sure that you have a light switch at the top of the stairs and the bottom  of the stairs. If you do not have them, ask someone to add them for you. What else can I do to help prevent falls? Wear shoes that: Do not have high heels. Have rubber bottoms. Are comfortable and fit you well. Are closed at the toe. Do not wear sandals. If you use a stepladder: Make sure that it is fully opened. Do not climb a closed stepladder. Make sure that both sides of the stepladder are locked into place. Ask someone to hold it for you, if possible. Clearly mark and make sure that you can see: Any grab bars or handrails. First and last steps. Where the edge of each step is. Use tools that help you move around (mobility aids) if they are needed. These include: Canes. Walkers. Scooters. Crutches. Turn on the lights when you go  into a dark area. Replace any light bulbs as soon as they burn out. Set up your furniture so you have a clear path. Avoid moving your furniture around. If any of your floors are uneven, fix them. If there are any pets around you, be aware of where they are. Review your medicines with your doctor. Some medicines can make you feel dizzy. This can increase your chance of falling. Ask your doctor what other things that you can do to help prevent falls. This information is not intended to replace advice given to you by your health care provider. Make sure you discuss any questions you have with your health care provider. Document Released: 07/26/2009 Document Revised: 03/06/2016 Document Reviewed: 11/03/2014 Elsevier Interactive Patient Education  2017 [employer].

## 2023-12-01 ENCOUNTER — Ambulatory Visit: Payer: Medicare Other | Admitting: Dermatology

## 2023-12-17 ENCOUNTER — Other Ambulatory Visit

## 2023-12-17 DIAGNOSIS — G4452 New daily persistent headache (NDPH): Secondary | ICD-10-CM

## 2023-12-17 DIAGNOSIS — E118 Type 2 diabetes mellitus with unspecified complications: Secondary | ICD-10-CM

## 2023-12-17 DIAGNOSIS — Z13220 Encounter for screening for lipoid disorders: Secondary | ICD-10-CM | POA: Diagnosis not present

## 2023-12-18 LAB — CBC WITH DIFFERENTIAL/PLATELET
Absolute Lymphocytes: 2310 {cells}/uL (ref 850–3900)
Absolute Monocytes: 645 {cells}/uL (ref 200–950)
Basophils Absolute: 38 {cells}/uL (ref 0–200)
Basophils Relative: 0.5 %
Eosinophils Absolute: 218 {cells}/uL (ref 15–500)
Eosinophils Relative: 2.9 %
HCT: 44.5 % (ref 38.5–50.0)
Hemoglobin: 14.5 g/dL (ref 13.2–17.1)
MCH: 29.9 pg (ref 27.0–33.0)
MCHC: 32.6 g/dL (ref 32.0–36.0)
MCV: 91.8 fL (ref 80.0–100.0)
MPV: 10.9 fL (ref 7.5–12.5)
Monocytes Relative: 8.6 %
Neutro Abs: 4290 {cells}/uL (ref 1500–7800)
Neutrophils Relative %: 57.2 %
Platelets: 243 10*3/uL (ref 140–400)
RBC: 4.85 10*6/uL (ref 4.20–5.80)
RDW: 12.6 % (ref 11.0–15.0)
Total Lymphocyte: 30.8 %
WBC: 7.5 10*3/uL (ref 3.8–10.8)

## 2023-12-18 LAB — PROTEIN / CREATININE RATIO, URINE
Creatinine, Urine: 193 mg/dL (ref 20–320)
Protein/Creat Ratio: 114 mg/g{creat} (ref 25–148)
Protein/Creatinine Ratio: 0.114 mg/mg{creat} (ref 0.025–0.148)
Total Protein, Urine: 22 mg/dL (ref 5–25)

## 2023-12-18 LAB — LIPID PANEL
Cholesterol: 192 mg/dL (ref <200)
HDL: 42 mg/dL (ref >=40)
LDL Cholesterol (Calc): 110 mg/dL — ABNORMAL HIGH
Non-HDL Cholesterol (Calc): 150 mg/dL — ABNORMAL HIGH (ref <130)
Total CHOL/HDL Ratio: 4.6 (calc) (ref <5.0)
Triglycerides: 277 mg/dL — ABNORMAL HIGH (ref <150)

## 2023-12-18 LAB — COMPLETE METABOLIC PANEL WITH GFR
AG Ratio: 1.5 (calc) (ref 1.0–2.5)
ALT: 16 U/L (ref 9–46)
AST: 12 U/L (ref 10–35)
Albumin: 4 g/dL (ref 3.6–5.1)
Alkaline phosphatase (APISO): 63 U/L (ref 35–144)
BUN: 18 mg/dL (ref 7–25)
CO2: 25 mmol/L (ref 20–32)
Calcium: 9.1 mg/dL (ref 8.6–10.3)
Chloride: 105 mmol/L (ref 98–110)
Creat: 1.12 mg/dL (ref 0.70–1.28)
Globulin: 2.7 g/dL (ref 1.9–3.7)
Glucose, Bld: 235 mg/dL — ABNORMAL HIGH (ref 65–99)
Potassium: 4.5 mmol/L (ref 3.5–5.3)
Sodium: 138 mmol/L (ref 135–146)
Total Bilirubin: 0.5 mg/dL (ref 0.2–1.2)
Total Protein: 6.7 g/dL (ref 6.1–8.1)
eGFR: 67 mL/min/{1.73_m2} (ref >=60)

## 2023-12-18 LAB — HEMOGLOBIN A1C
Hgb A1c MFr Bld: 9.6 %{Hb} — ABNORMAL HIGH (ref <5.7)
Mean Plasma Glucose: 229 mg/dL
eAG (mmol/L): 12.7 mmol/L

## 2023-12-22 ENCOUNTER — Encounter: Payer: Self-pay | Admitting: Family Medicine

## 2023-12-22 ENCOUNTER — Ambulatory Visit: Admitting: Family Medicine

## 2023-12-22 VITALS — BP 116/58 | HR 70 | Ht 73.0 in | Wt 241.0 lb

## 2023-12-22 DIAGNOSIS — Z7984 Long term (current) use of oral hypoglycemic drugs: Secondary | ICD-10-CM

## 2023-12-22 DIAGNOSIS — E1140 Type 2 diabetes mellitus with diabetic neuropathy, unspecified: Secondary | ICD-10-CM | POA: Diagnosis not present

## 2023-12-22 DIAGNOSIS — E118 Type 2 diabetes mellitus with unspecified complications: Secondary | ICD-10-CM

## 2023-12-22 MED ORDER — METFORMIN HCL 500 MG PO TABS: 1000.0000 mg | ORAL_TABLET | Freq: Two times a day (BID) | ORAL | 3 refills | Status: DC

## 2023-12-22 MED ORDER — EMPAGLIFLOZIN 25 MG PO TABS: 25.0000 mg | ORAL_TABLET | Freq: Every day | ORAL | 3 refills | Status: AC

## 2023-12-22 MED ORDER — ROSUVASTATIN CALCIUM 10 MG PO TABS: 10.0000 mg | ORAL_TABLET | Freq: Every day | ORAL | 3 refills | Status: DC

## 2023-12-22 MED ORDER — PIOGLITAZONE HCL 30 MG PO TABS: 30.0000 mg | ORAL_TABLET | Freq: Every day | ORAL | 3 refills | Status: AC

## 2023-12-22 NOTE — Progress Notes (Signed)
 Subjective:    Patient ID: Cameron Young, male    DOB: [birth year], 78 y.o.   MRN: [id number] Patient is here today for a checkup.  He has a history of diabetes.  His most recent hemoglobin A1c was 9.6 has been on metformin twice daily with Actos as well as Jardiance.  He is only been taking metformin once a day.  He has not been taking Jardiance at all.  He admits to drinking a Pepsi every day.  He also eats bread and potatoes and occasional rice.  He denies any chest pain shortness of breath or dyspnea on exertion.  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 Lab on 12/17/2023  Component Date Value Ref Range Status   WBC 12/17/2023 7.5  3.8 - 10.8 Thousand/uL Final   RBC 12/17/2023 4.85  4.20 - 5.80 Million/uL Final   Hemoglobin 12/17/2023 14.5  13.2 - 17.1 g/dL Final   HCT 40/98/1191 44.5  38.5 - 50.0 % Final   MCV 12/17/2023 91.8  80.0 - 100.0 fL Final   MCH 12/17/2023 29.9  27.0 - 33.0 pg Final   MCHC 12/17/2023 32.6  32.0 - 36.0 g/dL Final   Comment: For adults, a slight decrease in the calculated MCHC value (in the range of 30 to 32 g/dL) is most likely not clinically significant; however, it should be interpreted with caution in correlation with other red cell parameters and the patient's clinical condition.    RDW 12/17/2023 12.6  11.0 - 15.0 % Final   Platelets 12/17/2023 243  140 - 400 Thousand/uL Final   MPV 12/17/2023 10.9  7.5 - 12.5 fL Final   Neutro Abs 12/17/2023 4,290  1,500 - 7,800 cells/uL Final   Absolute Lymphocytes 12/17/2023 2,310  850 - 3,900 cells/uL Final   Absolute Monocytes 12/17/2023 645  200 - 950 cells/uL Final   Eosinophils Absolute 12/17/2023 218  15 - 500 cells/uL Final   Basophils Absolute 12/17/2023 38  0 - 200 cells/uL Final   Neutrophils Relative % 12/17/2023 57.2  % Final   Total Lymphocyte 12/17/2023 30.8  % Final   Monocytes Relative 12/17/2023 8.6  % Final   Eosinophils Relative 12/17/2023 2.9  % Final   Basophils  Relative 12/17/2023 0.5  % Final   Hgb A1c MFr Bld 12/17/2023 9.6 (H)  <5.7 % of total Hgb Final   Comment: For someone without known diabetes, a hemoglobin A1c value of 6.5% or greater indicates that they may have  diabetes and this should be confirmed with a follow-up  test. . For someone with known diabetes, a value <7% indicates  that their diabetes is well controlled and a value  greater than or equal to 7% indicates suboptimal  control. A1c targets should be individualized based on  duration of diabetes, age, comorbid conditions, and  other considerations. . Currently, no consensus exists regarding use of hemoglobin A1c for diagnosis of diabetes for children. .    Mean Plasma Glucose 12/17/2023 229  mg/dL Final   eAG (mmol/L) 47/82/9562 12.7  mmol/L Final   Glucose, Bld 12/17/2023 235 (H)  65 - 99 mg/dL Final   Comment: .            Fasting reference interval . For someone without known diabetes, a glucose value >125 mg/dL indicates that they may have diabetes and this should be confirmed with a follow-up test. .    BUN 12/17/2023 18  7 - 25 mg/dL Final  Creat 12/17/2023 1.12  0.70 - 1.28 mg/dL Final   eGFR 16/07/9603 67  > OR = 60 mL/min/1.49m2 Final   BUN/Creatinine Ratio 12/17/2023 SEE NOTE:  6 - 22 (calc) Final   Comment:    Not Reported: BUN and Creatinine are within    reference range. .    Sodium 12/17/2023 138  135 - 146 mmol/L Final   Potassium 12/17/2023 4.5  3.5 - 5.3 mmol/L Final   Chloride 12/17/2023 105  98 - 110 mmol/L Final   CO2 12/17/2023 25  20 - 32 mmol/L Final   Calcium 12/17/2023 9.1  8.6 - 10.3 mg/dL Final   Total Protein 54/06/8118 6.7  6.1 - 8.1 g/dL Final   Albumin 14/78/2956 4.0  3.6 - 5.1 g/dL Final   Globulin 21/30/8657 2.7  1.9 - 3.7 g/dL (calc) Final   AG Ratio 12/17/2023 1.5  1.0 - 2.5 (calc) Final   Total Bilirubin 12/17/2023 0.5  0.2 - 1.2 mg/dL Final   Alkaline phosphatase (APISO) 12/17/2023 63  35 - 144 U/L Final   AST  12/17/2023 12  10 - 35 U/L Final   ALT 12/17/2023 16  9 - 46 U/L Final   Cholesterol 12/17/2023 192  <200 mg/dL Final   HDL 84/69/6295 42  > OR = 40 mg/dL Final   Triglycerides 28/41/3244 277 (H)  <150 mg/dL Final   Comment: . If a non-fasting specimen was collected, consider repeat triglyceride testing on a fasting specimen if clinically indicated.  Perry Mount et al. J. of Clin. Lipidol. 2015;9:129-169. Marland Kitchen    LDL Cholesterol (Calc) 12/17/2023 110 (H)  mg/dL (calc) Final   Comment: Reference range: <100 . Desirable range <100 mg/dL for primary prevention;   <70 mg/dL for patients with CHD or diabetic patients  with > or = 2 CHD risk factors. Marland Kitchen LDL-C is now calculated using the Martin-Hopkins  calculation, which is a validated novel method providing  better accuracy than the Friedewald equation in the  estimation of LDL-C.  Horald Pollen et al. Lenox Ahr. 0102;725(36): 2061-2068  (http://education.[facility].com/faq/FAQ164)    Total CHOL/HDL Ratio 12/17/2023 4.6  <6.4 (calc) Final   Non-HDL Cholesterol (Calc) 12/17/2023 150 (H)  <130 mg/dL (calc) Final   Comment: For patients with diabetes plus 1 major ASCVD risk  factor, treating to a non-HDL-C goal of <100 mg/dL  (LDL-C of <40 mg/dL) is considered a therapeutic  option.    Creatinine, Urine 12/17/2023 193  20 - 320 mg/dL Final   Protein/Creat Ratio 12/17/2023 114  25 - 148 mg/g creat Final   Protein/Creatinine Ratio 12/17/2023 0.114  0.025 - 0.148 mg/mg creat Final   Total Protein, Urine 12/17/2023 22  5 - 25 mg/dL Final     Past Medical History:  Diagnosis Date   Arthritis    Borderline diabetic    DVT (deep venous thrombosis) (HCC) 10/13/2010   right leg   H/O bronchitis 25 years ago   x1   History of kidney stones    x2   Osteoporosis    Past Surgical History:  Procedure Laterality Date   HEMI-MICRODISCECTOMY LUMBAR LAMINECTOMY LEVEL 1 Right 06/02/2014   Procedure: LUMBAR LAMINECTOMY,CENTRAL  DECOMPRESSION,  MICRODISCECTOMY, FORAMINOTOMY  LUMBAR FOUR TO LUMBAR FIVE RIGHT;  Surgeon: Jacki Cones, MD;  Location: [facility];  Service: Orthopedics;  Laterality: Right;   SPINE SURGERY     Current Outpatient Medications on File Prior to Visit  Medication Sig Dispense Refill   cyanocobalamin (VITAMIN B12) 1000 MCG tablet Take 1  tablet (1,000 mcg total) by mouth daily. 30 tablet 3   empagliflozin (JARDIANCE) 25 MG TABS tablet Take 1 tablet (25 mg total) by mouth daily before breakfast. 30 tablet 3   metFORMIN (GLUCOPHAGE) 500 MG tablet Take 2 tablets (1,000 mg total) by mouth 2 (two) times daily with a meal. 60 tablet 1   pioglitazone (ACTOS) 30 MG tablet Take 1 tablet (30 mg total) by mouth daily. 30 tablet 0   rosuvastatin (CRESTOR) 10 MG tablet Take 1 tablet (10 mg total) by mouth daily. 90 tablet 3   tiZANidine (ZANAFLEX) 4 MG tablet Take 1 tablet (4 mg total) by mouth every 6 (six) hours as needed (headache). 30 tablet 0   No current facility-administered medications on file prior to visit.   No Known Allergies Social History   Socioeconomic History   Marital status: Married    Spouse name: Becky   Number of children: 1   Years of education: Not on file   Highest education level: Not on file  Occupational History   Not on file  Tobacco Use   Smoking status: Never   Smokeless tobacco: Never  Vaping Use   Vaping status: Never Used  Substance and Sexual Activity   Alcohol use: No   Drug use: No   Sexual activity: Not Currently    Birth control/protection: None  Other Topics Concern   Not on file  Social History Narrative   Right handed   Jimmye Norman   Lives with wife, [place]. Married since 2008.   2 great grandsons.    Social Drivers of Corporate investment banker Strain: Low Risk  (11/19/2023)   Overall Financial Resource Strain (CARDIA)    Difficulty of Paying Living Expenses: Not hard at all  Food Insecurity: No Food Insecurity (11/19/2023)   Hunger Vital Sign    Worried About  Running Out of Food in the Last Year: Never true    Ran Out of Food in the Last Year: Never true  Transportation Needs: No Transportation Needs (11/19/2023)   PRAPARE - Administrator, Civil Service (Medical): No    Lack of Transportation (Non-Medical): No  Physical Activity: Sufficiently Active (11/19/2023)   Exercise Vital Sign    Days of Exercise per Week: 5 days    Minutes of Exercise per Session: 30 min  Stress: No Stress Concern Present (11/19/2023)   [employer] of Occupational Health - Occupational Stress Questionnaire    Feeling of Stress : Not at all  Social Connections: Socially Integrated (11/19/2023)   Social Connection and Isolation Panel [NHANES]    Frequency of Communication with Friends and Family: More than three times a week    Frequency of Social Gatherings with Friends and Family: More than three times a week    Attends Religious Services: More than 4 times per year    Active Member of [employer] or Organizations: Yes    Attends Engineer, structural: More than 4 times per year    Marital Status: Married  Catering manager Violence: Not At Risk (11/19/2023)   Humiliation, Afraid, Rape, and Kick questionnaire    Fear of Current or Ex-Partner: No    Emotionally Abused: No    Physically Abused: No    Sexually Abused: No     Review of Systems  Neurological:  Positive for headaches.  All other systems reviewed and are negative.      Objective:   Physical Exam Vitals reviewed.  Constitutional:  General: He is not in acute distress.    Appearance: Normal appearance. He is not ill-appearing.  HENT:     Head: Normocephalic and atraumatic.   Eyes:     General: No visual field deficit.    Extraocular Movements: Extraocular movements intact.  Cardiovascular:     Rate and Rhythm: Normal rate and regular rhythm.     Pulses: Normal pulses.     Heart sounds: Normal heart sounds. No murmur heard.    No friction rub. No gallop.  Pulmonary:      Effort: Pulmonary effort is normal. No respiratory distress.     Breath sounds: Normal breath sounds. No stridor. No wheezing, rhonchi or rales.  Chest:     Chest wall: No tenderness.  Abdominal:     General: Bowel sounds are normal. There is no distension.     Palpations: Abdomen is soft.     Tenderness: There is no abdominal tenderness. There is no guarding or rebound.  Musculoskeletal:     Cervical back: Normal range of motion.  Neurological:     Mental Status: He is alert and oriented to person, place, and time. Mental status is at baseline.     Cranial Nerves: No cranial nerve deficit, dysarthria or facial asymmetry.     Sensory: No sensory deficit.     Motor: No weakness.     Coordination: Romberg sign negative. Coordination normal.     Gait: Gait normal.     Deep Tendon Reflexes: Reflexes normal.  Psychiatric:        Mood and Affect: Mood normal.        Speech: Speech normal.        Behavior: Behavior normal.           Assessment & Plan:  Type 2 diabetes mellitus with diabetic neuropathy, without long-term current use of insulin (HCC)  Controlled type 2 diabetes mellitus with complication, without long-term current use of insulin (HCC) - Plan: metFORMIN (GLUCOPHAGE) 500 MG tablet, pioglitazone (ACTOS) 30 MG tablet Diabetes is not well-controlled.  Increase metformin to 1000 mg twice daily, continue Actos 30 mg daily, resume Jardiance 25 mg daily.  Discontinue Pepsi.  Reduce carbohydrates in diet.  Recheck hemoglobin A1c in 3 months.

## 2024-04-04 ENCOUNTER — Other Ambulatory Visit: Payer: Self-pay

## 2024-04-04 ENCOUNTER — Telehealth: Payer: Self-pay

## 2024-04-04 MED ORDER — LANCETS MISC. MISC: 1.0000 | Freq: Three times a day (TID) | 0 refills | Status: AC

## 2024-04-04 MED ORDER — BLOOD GLUCOSE TEST VI STRP: 1.0000 | ORAL_STRIP | Freq: Three times a day (TID) | 0 refills | Status: AC

## 2024-04-04 MED ORDER — BLOOD GLUCOSE MONITORING SUPPL DEVI: 1.0000 | Freq: Three times a day (TID) | 0 refills | Status: AC

## 2024-04-04 MED ORDER — LANCET DEVICE MISC: 1.0000 | Freq: Three times a day (TID) | 0 refills | Status: AC

## 2024-04-04 NOTE — Telephone Encounter (Signed)
 Copied from CRM [id number]. Topic: Clinical - Medication Question >> Apr 04, 2024  9:59 AM Wess RAMAN wrote: Reason for CRM: Patient's daughter, Merianne, is requesting a Blood sugar meter be called in to preferred pharmacy.  Preferred Pharmacy:  [facility] [address] [facility] - [place], [place] - [address] [facility] [address] [place] [address] Phone: [phone] Fax: [phone] Hours: Not open 24 hours  Daughter's Callback #: [phone]

## 2024-04-22 ENCOUNTER — Ambulatory Visit: Admitting: Family Medicine

## 2024-04-22 ENCOUNTER — Encounter: Payer: Self-pay | Admitting: Family Medicine

## 2024-04-22 VITALS — BP 122/70 | HR 77 | Temp 98.2°F | Ht 73.0 in | Wt 237.0 lb

## 2024-04-22 DIAGNOSIS — Z7984 Long term (current) use of oral hypoglycemic drugs: Secondary | ICD-10-CM | POA: Diagnosis not present

## 2024-04-22 DIAGNOSIS — E1140 Type 2 diabetes mellitus with diabetic neuropathy, unspecified: Secondary | ICD-10-CM | POA: Diagnosis not present

## 2024-04-22 NOTE — Progress Notes (Signed)
 Subjective:    Patient ID: Cameron Young, male    DOB: Mar 29, 1946, 78 y.o.   MRN: [id number] Patient is here today for a checkup.  He has a history of diabetes.  His most recent hemoglobin A1c was 9.6 in March.  Currently on Jardiance , metformin , and actos .  Patient has been taking metformin  1 pill in the morning.  Therefore he is on 500 mg in the morning.  He has not been taking it thousand twice a day.  This was a miscommunication.  He is not regularly checking his sugars.  He denies any polyuria polydipsia or blurry vision.  He denies any chest pain or breath.  His blood pressure today is outstanding.    Past Medical History:  Diagnosis Date   Arthritis    Diabetes mellitus without complication (HCC)    DVT (deep venous thrombosis) (HCC) 10/13/2010   right leg   H/O bronchitis 25 years ago   x1   History of kidney stones    x2   Osteoporosis    Past Surgical History:  Procedure Laterality Date   HEMI-MICRODISCECTOMY LUMBAR LAMINECTOMY LEVEL 1 Right 06/02/2014   Procedure: LUMBAR LAMINECTOMY,CENTRAL  DECOMPRESSION, MICRODISCECTOMY, FORAMINOTOMY  LUMBAR FOUR TO LUMBAR FIVE RIGHT;  Surgeon: Tanda DELENA Heading, MD;  Location: [facility];  Service: Orthopedics;  Laterality: Right;   SPINE SURGERY     Current Outpatient Medications on File Prior to Visit  Medication Sig Dispense Refill   Blood Glucose Monitoring Suppl DEVI 1 each by Does not apply route in the morning, at noon, and at bedtime. May substitute to any manufacturer covered by patient's insurance. 1 each 0   cyanocobalamin  (VITAMIN B12) 1000 MCG tablet Take 1 tablet (1,000 mcg total) by mouth daily. 30 tablet 3   empagliflozin  (JARDIANCE ) 25 MG TABS tablet Take 1 tablet (25 mg total) by mouth daily before breakfast. 90 tablet 3   Glucose Blood (BLOOD GLUCOSE TEST STRIPS) STRP 1 each by In Vitro route in the morning, at noon, and at bedtime. May substitute to any manufacturer covered by patient's insurance. 100 strip 0   Lancet  Device MISC 1 each by Does not apply route in the morning, at noon, and at bedtime. May substitute to any manufacturer covered by patient's insurance. 1 each 0   Lancets Misc. MISC 1 each by Does not apply route in the morning, at noon, and at bedtime. May substitute to any manufacturer covered by patient's insurance. 100 each 0   metFORMIN  (GLUCOPHAGE ) 500 MG tablet Take 2 tablets (1,000 mg total) by mouth 2 (two) times daily with a meal. 180 tablet 3   pioglitazone  (ACTOS ) 30 MG tablet Take 1 tablet (30 mg total) by mouth daily. 90 tablet 3   rosuvastatin  (CRESTOR ) 10 MG tablet Take 1 tablet (10 mg total) by mouth daily. 90 tablet 3   empagliflozin  (JARDIANCE ) 25 MG TABS tablet Take 1 tablet (25 mg total) by mouth daily before breakfast. (Patient not taking: Reported on 04/22/2024) 30 tablet 3   tiZANidine  (ZANAFLEX ) 4 MG tablet Take 1 tablet (4 mg total) by mouth every 6 (six) hours as needed (headache). (Patient not taking: Reported on 04/22/2024) 30 tablet 0   No current facility-administered medications on file prior to visit.   No Known Allergies Social History   Socioeconomic History   Marital status: Married    Spouse name: Becky   Number of children: 1   Years of education: Not on file   Highest education level:  Not on file  Occupational History   Not on file  Tobacco Use   Smoking status: Never   Smokeless tobacco: Never  Vaping Use   Vaping status: Never Used  Substance and Sexual Activity   Alcohol use: No   Drug use: No   Sexual activity: Not Currently    Birth control/protection: None  Other Topics Concern   Not on file  Social History Narrative   Right handed   [place]   Lives with wife, Rhoda. Married since 2008.   2 great grandsons.    Social Drivers of Corporate investment banker Strain: Low Risk  (11/19/2023)   Overall Financial Resource Strain (CARDIA)    Difficulty of Paying Living Expenses: Not hard at all  Food Insecurity: No Food Insecurity (11/19/2023)    Hunger Vital Sign    Worried About Running Out of Food in the Last Year: Never true    Ran Out of Food in the Last Year: Never true  Transportation Needs: No Transportation Needs (11/19/2023)   PRAPARE - Administrator, Civil Service (Medical): No    Lack of Transportation (Non-Medical): No  Physical Activity: Sufficiently Active (11/19/2023)   Exercise Vital Sign    Days of Exercise per Week: 5 days    Minutes of Exercise per Session: 30 min  Stress: No Stress Concern Present (11/19/2023)   [employer] of Occupational Health - Occupational Stress Questionnaire    Feeling of Stress : Not at all  Social Connections: Socially Integrated (11/19/2023)   Social Connection and Isolation Panel    Frequency of Communication with Friends and Family: More than three times a week    Frequency of Social Gatherings with Friends and Family: More than three times a week    Attends Religious Services: More than 4 times per year    Active Member of [employer] or Organizations: Yes    Attends Banker Meetings: More than 4 times per year    Marital Status: Married  Catering manager Violence: Not At Risk (11/19/2023)   Humiliation, Afraid, Rape, and Kick questionnaire    Fear of Current or Ex-Partner: No    Emotionally Abused: No    Physically Abused: No    Sexually Abused: No     Review of Systems  Neurological:  Positive for headaches.  All other systems reviewed and are negative.      Objective:   Physical Exam Vitals reviewed.  Constitutional:      General: He is not in acute distress.    Appearance: Normal appearance. He is not ill-appearing.  HENT:     Head: Normocephalic and atraumatic.  Eyes:     General: No visual field deficit.    Extraocular Movements: Extraocular movements intact.  Cardiovascular:     Rate and Rhythm: Normal rate and regular rhythm.     Pulses: Normal pulses.     Heart sounds: Normal heart sounds. No murmur heard.    No friction rub. No  gallop.  Pulmonary:     Effort: Pulmonary effort is normal. No respiratory distress.     Breath sounds: Normal breath sounds. No stridor. No wheezing, rhonchi or rales.  Chest:     Chest wall: No tenderness.  Abdominal:     General: Bowel sounds are normal. There is no distension.     Palpations: Abdomen is soft.     Tenderness: There is no abdominal tenderness. There is no guarding or rebound.  Musculoskeletal:  Cervical back: Normal range of motion.  Neurological:     Mental Status: He is alert and oriented to person, place, and time. Mental status is at baseline.     Cranial Nerves: No cranial nerve deficit, dysarthria or facial asymmetry.     Sensory: No sensory deficit.     Motor: No weakness.     Coordination: Romberg sign negative. Coordination normal.     Gait: Gait normal.     Deep Tendon Reflexes: Reflexes normal.  Psychiatric:        Mood and Affect: Mood normal.        Speech: Speech normal.        Behavior: Behavior normal.           Assessment & Plan:  Type 2 diabetes mellitus with diabetic neuropathy, without long-term current use of insulin (HCC) - Plan: Hemoglobin A1c, CBC with Differential/Platelet, Comprehensive metabolic panel with GFR, Lipid panel, Microalbumin/Creatinine Ratio, Urine, CT CARDIAC SCORING (SELF PAY ONLY) I am concerned diabetes will not be adequately controlled if he has not been taking metformin  1000 mg twice daily.  We will check an A1c today.  If his A1c is less than 8, I believe we can get the rest of the way there by increasing metformin  to 1000 twice daily.  It is less than 7, we will continue our current strategy.  If it is greater than 8 I plan to add Ozempic.  We discussed a coronary artery calcium  score and the patient would like to order this test.  Meanwhile I will check a fasting lipid panel.  Goal LDL cholesterol is less than 100.  Check urine protein creatinine ratio to evaluate for any diabetic nephropathy.

## 2024-04-23 LAB — CBC WITH DIFFERENTIAL/PLATELET
Absolute Lymphocytes: 2243 {cells}/uL (ref 850–3900)
Absolute Monocytes: 697 {cells}/uL (ref 200–950)
Basophils Absolute: 50 {cells}/uL (ref 0–200)
Basophils Relative: 0.6 %
Eosinophils Absolute: 252 {cells}/uL (ref 15–500)
Eosinophils Relative: 3 %
HCT: 41.6 % (ref 38.5–50.0)
Hemoglobin: 13.9 g/dL (ref 13.2–17.1)
MCH: 31 pg (ref 27.0–33.0)
MCHC: 33.4 g/dL (ref 32.0–36.0)
MCV: 92.9 fL (ref 80.0–100.0)
MPV: 10.7 fL (ref 7.5–12.5)
Monocytes Relative: 8.3 %
Neutro Abs: 5158 {cells}/uL (ref 1500–7800)
Neutrophils Relative %: 61.4 %
Platelets: 248 Thousand/uL (ref 140–400)
RBC: 4.48 Million/uL (ref 4.20–5.80)
RDW: 12.5 % (ref 11.0–15.0)
Total Lymphocyte: 26.7 %
WBC: 8.4 Thousand/uL (ref 3.8–10.8)

## 2024-04-23 LAB — COMPREHENSIVE METABOLIC PANEL WITH GFR
AG Ratio: 1.7 (calc) (ref 1.0–2.5)
ALT: 16 U/L (ref 9–46)
AST: 15 U/L (ref 10–35)
Albumin: 3.9 g/dL (ref 3.6–5.1)
Alkaline phosphatase (APISO): 48 U/L (ref 35–144)
BUN: 19 mg/dL (ref 7–25)
CO2: 24 mmol/L (ref 20–32)
Calcium: 9 mg/dL (ref 8.6–10.3)
Chloride: 106 mmol/L (ref 98–110)
Creat: 1.21 mg/dL (ref 0.70–1.28)
Globulin: 2.3 g/dL (ref 1.9–3.7)
Glucose, Bld: 154 mg/dL — ABNORMAL HIGH (ref 65–99)
Potassium: 4.6 mmol/L (ref 3.5–5.3)
Sodium: 139 mmol/L (ref 135–146)
Total Bilirubin: 0.4 mg/dL (ref 0.2–1.2)
Total Protein: 6.2 g/dL (ref 6.1–8.1)
eGFR: 61 mL/min/1.73m2 (ref >=60)

## 2024-04-23 LAB — MICROALBUMIN / CREATININE URINE RATIO
Creatinine, Urine: 202 mg/dL (ref 20–320)
Microalb Creat Ratio: 4 mg/g{creat} (ref <30)
Microalb, Ur: 0.8 mg/dL

## 2024-04-23 LAB — LIPID PANEL
Cholesterol: 151 mg/dL (ref <200)
HDL: 46 mg/dL (ref >=40)
LDL Cholesterol (Calc): 75 mg/dL
Non-HDL Cholesterol (Calc): 105 mg/dL (ref <130)
Total CHOL/HDL Ratio: 3.3 (calc) (ref <5.0)
Triglycerides: 207 mg/dL — ABNORMAL HIGH (ref <150)

## 2024-04-23 LAB — HEMOGLOBIN A1C
Hgb A1c MFr Bld: 8.7 % — ABNORMAL HIGH (ref <5.7)
Mean Plasma Glucose: 203 mg/dL
eAG (mmol/L): 11.2 mmol/L

## 2024-04-25 ENCOUNTER — Ambulatory Visit: Payer: Self-pay | Admitting: Family Medicine

## 2024-04-25 ENCOUNTER — Telehealth: Payer: Self-pay

## 2024-04-25 ENCOUNTER — Other Ambulatory Visit: Payer: Self-pay

## 2024-04-25 DIAGNOSIS — E118 Type 2 diabetes mellitus with unspecified complications: Secondary | ICD-10-CM

## 2024-04-25 MED ORDER — METFORMIN HCL 500 MG PO TABS: 1000.0000 mg | ORAL_TABLET | Freq: Two times a day (BID) | ORAL | 3 refills | Status: AC

## 2024-04-25 NOTE — Telephone Encounter (Signed)
 Copied from CRM [id number]. Topic: Clinical - Lab/Test Results >> Apr 25, 2024 11:58 AM Delon DASEN wrote: Reason for CRM: Mrs Spielmann [phone]- returning call to Ronal Bradley about labs and question

## 2024-05-18 ENCOUNTER — Ambulatory Visit (HOSPITAL_COMMUNITY)
Admission: RE | Admit: 2024-05-18 | Discharge: 2024-05-18 | Disposition: A | Discharge status: Home / Self Care | Payer: Self-pay | Source: Ambulatory Visit | Attending: Family Medicine | Admitting: Family Medicine

## 2024-05-18 DIAGNOSIS — E1140 Type 2 diabetes mellitus with diabetic neuropathy, unspecified: Secondary | ICD-10-CM | POA: Insufficient documentation

## 2024-05-19 ENCOUNTER — Other Ambulatory Visit: Payer: Self-pay

## 2024-05-19 DIAGNOSIS — E1140 Type 2 diabetes mellitus with diabetic neuropathy, unspecified: Secondary | ICD-10-CM

## 2024-05-19 DIAGNOSIS — R931 Abnormal findings on diagnostic imaging of heart and coronary circulation: Secondary | ICD-10-CM

## 2024-05-19 MED ORDER — ASPIRIN 81 MG PO TBEC: 81.0000 mg | DELAYED_RELEASE_TABLET | Freq: Every day | ORAL | 1 refills | Status: AC

## 2024-05-19 MED ORDER — ROSUVASTATIN CALCIUM 20 MG PO TABS: 20.0000 mg | ORAL_TABLET | Freq: Every day | ORAL | 3 refills | Status: AC

## 2024-05-31 NOTE — Progress Notes (Signed)
 Pt. Informed thur my chart letter due to no response thru phone. Awaiting pt. Response

## 2024-07-12 ENCOUNTER — Encounter: Payer: Self-pay | Admitting: Family Medicine

## 2024-07-13 ENCOUNTER — Telehealth: Payer: Self-pay

## 2024-07-13 NOTE — Telephone Encounter (Signed)
 Copied from CRM #[id number]. Topic: General - Other >> Jul 13, 2024 12:21 PM Delon DASEN wrote: Reason for CRM: wife returning call to [facility]- please call [phone]

## 2024-07-13 NOTE — Telephone Encounter (Signed)
 Called wife Rhoda, was not able to gain an answer. Lvm for her to call back.

## 2024-11-17 ENCOUNTER — Ambulatory Visit: Admitting: Family Medicine

## 2024-11-22 ENCOUNTER — Ambulatory Visit: Admitting: Family Medicine

## 2024-11-24 ENCOUNTER — Ambulatory Visit: Payer: Medicare Other
